# Patient Record
Sex: Female | Born: 1985 | Race: White | Hispanic: No | Marital: Married | Smoking: Current every day smoker
Health system: Southern US, Community
[De-identification: ages and names within clinical notes are randomized; demographics above are authoritative.]

## PROBLEM LIST (undated history)

## (undated) DIAGNOSIS — O99311 Alcohol use complicating pregnancy, first trimester: Secondary | ICD-10-CM

## (undated) DIAGNOSIS — R011 Cardiac murmur, unspecified: Secondary | ICD-10-CM

## (undated) DIAGNOSIS — N760 Acute vaginitis: Secondary | ICD-10-CM

## (undated) DIAGNOSIS — B9689 Other specified bacterial agents as the cause of diseases classified elsewhere: Secondary | ICD-10-CM

## (undated) DIAGNOSIS — B1920 Unspecified viral hepatitis C without hepatic coma: Secondary | ICD-10-CM

## (undated) DIAGNOSIS — N751 Abscess of Bartholin's gland: Secondary | ICD-10-CM

## (undated) HISTORY — PX: OTHER SURGICAL HISTORY: SHX169

---
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
## 2013-08-04 ENCOUNTER — Emergency Department (HOSPITAL_COMMUNITY)
Admission: EM | Admit: 2013-08-04 | Discharge: 2013-08-04 | Disposition: A | Discharge status: Home / Self Care | Payer: Self-pay | Attending: Emergency Medicine | Admitting: Emergency Medicine

## 2013-08-04 ENCOUNTER — Encounter (HOSPITAL_COMMUNITY): Payer: Self-pay | Admitting: Emergency Medicine

## 2013-08-04 DIAGNOSIS — F17200 Nicotine dependence, unspecified, uncomplicated: Secondary | ICD-10-CM | POA: Insufficient documentation

## 2013-08-04 DIAGNOSIS — N750 Cyst of Bartholin's gland: Secondary | ICD-10-CM | POA: Insufficient documentation

## 2013-08-04 DIAGNOSIS — Z3202 Encounter for pregnancy test, result negative: Secondary | ICD-10-CM | POA: Insufficient documentation

## 2013-08-04 LAB — POCT PREGNANCY, URINE: Preg Test, Ur: NEGATIVE

## 2013-08-04 MED ORDER — HYDROMORPHONE HCL PF 1 MG/ML IJ SOLN
1.0000 mg | Freq: Once | INTRAMUSCULAR | Status: AC
  Administered 2013-08-04: 1 mg via INTRAMUSCULAR
  Filled 2013-08-04: qty 1

## 2013-08-04 MED ORDER — OXYCODONE-ACETAMINOPHEN 5-325 MG PO TABS: 1.0000 | ORAL_TABLET | Freq: Four times a day (QID) | ORAL | Status: DC | PRN

## 2013-08-04 MED ORDER — OXYCODONE-ACETAMINOPHEN 5-325 MG PO TABS
2.0000 | ORAL_TABLET | Freq: Once | ORAL | Status: AC
  Administered 2013-08-04: 2 via ORAL
  Filled 2013-08-04: qty 2

## 2013-08-04 NOTE — ED Notes (Signed)
Heather PA at bedside.  

## 2013-08-04 NOTE — ED Notes (Signed)
Report given to Tiffany, RN. 

## 2013-08-04 NOTE — ED Notes (Signed)
Pt c/o abscess to left labia x 1 wk that has not come to a head and has had no drainage. Pt states she has a hx of vaginal abscesses before. Pt rate pain 10/10. Pt also states there is a possibility she may be pregnant.

## 2013-08-04 NOTE — ED Provider Notes (Signed)
CSN: [id number]     Arrival date & time 08/04/13  1201 History   First MD Initiated Contact with Patient 08/04/13 1218     Chief Complaint  Patient presents with  . Abscess   (Consider location/radiation/quality/duration/timing/severity/associated sxs/prior Treatment) HPI Comments: Patient presents with an abscess to the left labia.  She reports that the abscess has been present for the past 1.5 weeks and is gradually increasing in size.  She has not noticed any drainage from the area.  She reports that she has had a similar abscess twice in the past.  She denies fever, chills, nausea, vomiting, or vaginal discharge.  She is also concerned that she may be pregnant.  LMP was approximately 2-3 months ago.  She denies abdominal pain.  No history of DM.  Patient is a 27 y.o. female presenting with abscess. The history is provided by the patient.  Abscess   History reviewed. No pertinent past medical history. History reviewed. No pertinent past surgical history. History reviewed. No pertinent family history. History  Substance Use Topics  . Smoking status: Current Every Day Smoker -- 0.50 packs/day  . Smokeless tobacco: Not on file  . Alcohol Use: Not on file   OB History   Grav Para Term Preterm Abortions TAB SAB Ect Mult Living                 Review of Systems  All other systems reviewed and are negative.    Allergies  Aspirin and Neosporin  Home Medications  No current outpatient prescriptions on file. BP 133/82  Pulse 76  Temp(Src) 98.3 F (36.8 C) (Oral)  Resp 18  Ht 5' (1.524 m)  Wt 119 lb (53.978 kg)  BMI 23.24 kg/m2  SpO2 100%  LMP 05/21/2013 Physical Exam  Nursing note and vitals reviewed. Constitutional: She appears well-developed and well-nourished.  HENT:  Head: Normocephalic and atraumatic.  Mouth/Throat: Oropharynx is clear and moist.  Neck: Normal range of motion. Neck supple.  Cardiovascular: Normal rate, regular rhythm and normal heart sounds.    Pulmonary/Chest: Effort normal and breath sounds normal.  Abdominal: Soft. Bowel sounds are normal. There is no tenderness.  Genitourinary:  Left Bartholin cyst.  No active drainage.  No surrounding erythema, edema, or warmth.  Neurological: She is alert.  Skin: Skin is warm and dry.  Psychiatric: She has a normal mood and affect.    ED Course  Procedures (including critical care time) Labs Review Labs Reviewed  POCT PREGNANCY, URINE   Imaging Review No results found.  EKG Interpretation   None      INCISION AND DRAINAGE Performed by: Santiago Glad Consent: Verbal consent obtained. Risks and benefits: risks, benefits and alternatives were discussed Type: abscess  Body area: left labia  Anesthesia: local infiltration  Incision was made with a scalpel.  Local anesthetic: lidocaine 2% with epinephrine  Anesthetic total: 8 ml  Complexity: complex Blunt dissection to break up loculations  Drainage: purulent  Drainage amount: large  Packing material: Word catheter placed  Patient tolerance: Patient tolerated the procedure well with no immediate complications.    MDM  No diagnosis found. Patient with a left bartholin abscess.  Patient afebrile.  No surrounding cellulitis.  Patient is not immunocompromised.  Abscess incised and drained with Therefore, do not feel that antibiotics are indicated at this time.  Word catheter placed.  Patient given follow up with Gynecology. Return precautions given.    Santiago Glad, PA-C 08/05/13 [phone]

## 2013-08-04 NOTE — ED Notes (Signed)
Pt reports she has "a sore" on her labia x 1 week. Hx of abscesses in the past.

## 2013-08-04 NOTE — ED Notes (Signed)
Pt has hx of labial abs. Has another; thinks she could be pregnant. No drainage; afebrile.

## 2013-08-06 ENCOUNTER — Emergency Department (HOSPITAL_COMMUNITY)
Admission: EM | Admit: 2013-08-06 | Discharge: 2013-08-06 | Disposition: A | Discharge status: Home / Self Care | Payer: Self-pay | Attending: Emergency Medicine | Admitting: Emergency Medicine

## 2013-08-06 ENCOUNTER — Encounter (HOSPITAL_COMMUNITY): Payer: Self-pay | Admitting: Emergency Medicine

## 2013-08-06 DIAGNOSIS — F17200 Nicotine dependence, unspecified, uncomplicated: Secondary | ICD-10-CM | POA: Insufficient documentation

## 2013-08-06 DIAGNOSIS — N751 Abscess of Bartholin's gland: Secondary | ICD-10-CM

## 2013-08-06 MED ORDER — HYDROMORPHONE HCL PF 2 MG/ML IJ SOLN
2.0000 mg | Freq: Once | INTRAMUSCULAR | Status: AC
  Administered 2013-08-06: 2 mg via INTRAMUSCULAR
  Filled 2013-08-06: qty 1

## 2013-08-06 MED ORDER — HYDROCODONE-ACETAMINOPHEN 5-325 MG PO TABS: 1.0000 | ORAL_TABLET | Freq: Four times a day (QID) | ORAL | Status: DC | PRN

## 2013-08-06 MED ORDER — SULFAMETHOXAZOLE-TRIMETHOPRIM 800-160 MG PO TABS: 1.0000 | ORAL_TABLET | Freq: Two times a day (BID) | ORAL | Status: DC

## 2013-08-06 MED ORDER — ONDANSETRON 4 MG PO TBDP
4.0000 mg | ORAL_TABLET | Freq: Once | ORAL | Status: AC
  Administered 2013-08-06: 4 mg via ORAL
  Filled 2013-08-06: qty 1

## 2013-08-06 NOTE — ED Notes (Signed)
Pt reports that catheter for Bartholin's cyst came out this morning. Pt also reports abdominal pain with nausea.

## 2013-08-06 NOTE — ED Provider Notes (Addendum)
CSN: [id number]     Arrival date & time 08/06/13  1156 History   First MD Initiated Contact with Patient 08/06/13 1235     Chief Complaint  Patient presents with  . Bartholin's Cyst   (Consider location/radiation/quality/duration/timing/severity/associated sxs/prior Treatment) HPI Comments: Patient with a history of a Bartholin's cyst drained 2 days ago with a word catheter placed. The catheter fell out last night and patient was concerned about possible worsening swelling in reaccumulation of fluid. She states that she has been in severe pain and over-the-counter medications are not helping. She also states she was not given an antibiotic at that time. This is not the first time she has had a cyst  The history is provided by the patient.    History reviewed. No pertinent past medical history. History reviewed. No pertinent past surgical history. No family history on file. History  Substance Use Topics  . Smoking status: Current Every Day Smoker -- 0.50 packs/day  . Smokeless tobacco: Not on file  . Alcohol Use: No   OB History   Grav Para Term Preterm Abortions TAB SAB Ect Mult Living                 Review of Systems  All other systems reviewed and are negative.    Allergies  Aspirin and Neosporin  Home Medications  No current outpatient prescriptions on file. BP 129/85  Pulse 93  Temp(Src) 98.5 F (36.9 C) (Oral)  Resp 18  Ht 5' (1.524 m)  Wt 119 lb (53.978 kg)  BMI 23.24 kg/m2  SpO2 97%  LMP 05/21/2013 Physical Exam  Nursing note and vitals reviewed. Constitutional: She is oriented to person, place, and time. She appears well-developed and well-nourished. She appears distressed.  HENT:  Head: Normocephalic and atraumatic.  Eyes: Conjunctivae and EOM are normal. Pupils are equal, round, and reactive to light.  Neck: Neck supple.  Cardiovascular: Normal rate.   Pulmonary/Chest: Effort normal.  Abdominal: Soft. She exhibits no distension. There is no  tenderness. There is no rebound and no guarding.  Genitourinary:     Musculoskeletal: Normal range of motion. She exhibits no edema and no tenderness.  Neurological: She is alert and oriented to person, place, and time.  Skin: Skin is warm and dry. No rash noted. No erythema.  Psychiatric: She has a normal mood and affect. Her behavior is normal.    ED Course  Procedures (including critical care time) Labs Review Labs Reviewed - No data to display Imaging Review No results found.  EKG Interpretation   None       MDM   1. Bartholin's gland abscess     Patient presenting for recheck after a Bartholin's cyst drainage. The catheter fell out and she was concerned was getting worse. Patient went home without pain medicine or antibiotics until she would most likely benefit from both. At this time no evidence of reaccumulation of the cyst and no sign of induration or fluctuance. Will start on pain control and Bactrim.    Gwyneth Sprout, MD 08/06/13 1319  Gwyneth Sprout, MD 08/06/13 1321

## 2013-08-08 NOTE — ED Provider Notes (Signed)
Medical screening examination/treatment/procedure(s) were performed by non-physician practitioner and as supervising physician I was immediately available for consultation/collaboration.  EKG Interpretation   None        Holger Sokolowski R. Elizabeth Haff, MD 08/08/13 0657 

## 2014-04-10 ENCOUNTER — Emergency Department (HOSPITAL_COMMUNITY)
Admission: EM | Admit: 2014-04-10 | Discharge: 2014-04-11 | Discharge status: Against Medical Advice | Payer: Self-pay | Attending: Emergency Medicine | Admitting: Emergency Medicine

## 2014-04-10 ENCOUNTER — Encounter (HOSPITAL_COMMUNITY): Payer: Self-pay | Admitting: Emergency Medicine

## 2014-04-10 DIAGNOSIS — N949 Unspecified condition associated with female genital organs and menstrual cycle: Secondary | ICD-10-CM | POA: Insufficient documentation

## 2014-04-10 DIAGNOSIS — Z3202 Encounter for pregnancy test, result negative: Secondary | ICD-10-CM | POA: Insufficient documentation

## 2014-04-10 DIAGNOSIS — F17200 Nicotine dependence, unspecified, uncomplicated: Secondary | ICD-10-CM | POA: Insufficient documentation

## 2014-04-10 HISTORY — DX: Other specified bacterial agents as the cause of diseases classified elsewhere: B96.89

## 2014-04-10 HISTORY — DX: Unspecified viral hepatitis C without hepatic coma: B19.20

## 2014-04-10 HISTORY — DX: Other specified bacterial agents as the cause of diseases classified elsewhere: N76.0

## 2014-04-10 HISTORY — DX: Abscess of Bartholin's gland: N75.1

## 2014-04-10 LAB — URINALYSIS, ROUTINE W REFLEX MICROSCOPIC
Bilirubin Urine: NEGATIVE
Glucose, UA: NEGATIVE mg/dL
Ketones, ur: NEGATIVE mg/dL
LEUKOCYTES UA: NEGATIVE
NITRITE: NEGATIVE
PH: 6.5 (ref 5.0–8.0)
Protein, ur: NEGATIVE mg/dL
SPECIFIC GRAVITY, URINE: 1.029 (ref 1.005–1.030)
Urobilinogen, UA: 1 mg/dL (ref 0.0–1.0)

## 2014-04-10 LAB — COMPREHENSIVE METABOLIC PANEL
ALBUMIN: 3.7 g/dL (ref 3.5–5.2)
ALT: 113 U/L — ABNORMAL HIGH (ref 0–35)
AST: 64 U/L — ABNORMAL HIGH (ref 0–37)
Alkaline Phosphatase: 90 U/L (ref 39–117)
Anion gap: 19 — ABNORMAL HIGH (ref 5–15)
BUN: 11 mg/dL (ref 6–23)
CALCIUM: 8.8 mg/dL (ref 8.4–10.5)
CO2: 20 mEq/L (ref 19–32)
Chloride: 103 mEq/L (ref 96–112)
Creatinine, Ser: 0.75 mg/dL (ref 0.50–1.10)
GFR calc Af Amer: >90 mL/min (ref >90)
GFR calc non Af Amer: >90 mL/min (ref >90)
Glucose, Bld: 115 mg/dL — ABNORMAL HIGH (ref 70–99)
Potassium: 3.7 mEq/L (ref 3.7–5.3)
Sodium: 142 mEq/L (ref 137–147)
Total Bilirubin: 0.3 mg/dL (ref 0.3–1.2)
Total Protein: 7.3 g/dL (ref 6.0–8.3)

## 2014-04-10 LAB — CBC WITH DIFFERENTIAL/PLATELET
BASOS PCT: 0 % (ref 0–1)
Basophils Absolute: 0 10*3/uL (ref 0.0–0.1)
EOS ABS: 0.1 10*3/uL (ref 0.0–0.7)
EOS PCT: 2 % (ref 0–5)
HCT: 40.3 % (ref 36.0–46.0)
HEMOGLOBIN: 13.8 g/dL (ref 12.0–15.0)
Lymphocytes Relative: 30 % (ref 12–46)
Lymphs Abs: 1.9 10*3/uL (ref 0.7–4.0)
MCH: 32.6 pg (ref 26.0–34.0)
MCHC: 34.2 g/dL (ref 30.0–36.0)
MCV: 95.3 fL (ref 78.0–100.0)
MONOS PCT: 7 % (ref 3–12)
Monocytes Absolute: 0.4 10*3/uL (ref 0.1–1.0)
NEUTROS PCT: 61 % (ref 43–77)
Neutro Abs: 4 10*3/uL (ref 1.7–7.7)
Platelets: 165 10*3/uL (ref 150–400)
RBC: 4.23 MIL/uL (ref 3.87–5.11)
RDW: 14.7 % (ref 11.5–15.5)
WBC: 6.5 10*3/uL (ref 4.0–10.5)

## 2014-04-10 LAB — URINE MICROSCOPIC-ADD ON

## 2014-04-10 LAB — PREGNANCY, URINE: Preg Test, Ur: NEGATIVE

## 2014-04-10 NOTE — ED Notes (Addendum)
Pt. reports vaginal pain /low abdominal pain onset today , pt. stated [facility] incision/drainage of Bartholin's abscess. No fever or chills.

## 2014-04-10 NOTE — ED Notes (Signed)
Pager 12, which was pt's, was found in trash can in waiting room. PT no longer here.

## 2014-04-10 NOTE — ED Notes (Signed)
PT not answering for reassessment.

## 2014-04-10 NOTE — ED Notes (Addendum)
PT's fiance is combative and angry. States we are not doing a good job and she needs to be taken back now. RN explained triage system and that pt had been triaged appropriately. Reassured that ED was doing a good job and that she had several people in front of her and would be taken back in an orderly manner. Acknowledged pt's caring and frustration but reaffirmed pt triaged appropriately. AD, Zack made aware. PT unavailable for meeting with AD.

## 2014-05-10 ENCOUNTER — Emergency Department: Payer: Self-pay | Admitting: Emergency Medicine

## 2014-06-10 ENCOUNTER — Emergency Department: Payer: Self-pay | Admitting: Emergency Medicine

## 2014-08-13 ENCOUNTER — Emergency Department: Payer: Self-pay | Admitting: Emergency Medicine

## 2014-08-19 ENCOUNTER — Emergency Department: Payer: Self-pay | Admitting: Internal Medicine

## 2014-08-19 LAB — CBC WITH DIFFERENTIAL/PLATELET
Basophil #: 0 10*3/uL (ref 0.0–0.1)
Basophil %: 0.3 %
EOS PCT: 0.4 %
Eosinophil #: 0 10*3/uL (ref 0.0–0.7)
HCT: 39.2 % (ref 35.0–47.0)
HGB: 13.5 g/dL (ref 12.0–16.0)
LYMPHS PCT: 36.6 %
Lymphocyte #: 2 10*3/uL (ref 1.0–3.6)
MCH: 35 pg — ABNORMAL HIGH (ref 26.0–34.0)
MCHC: 34.4 g/dL (ref 32.0–36.0)
MCV: 102 fL — AB (ref 80–100)
Monocyte #: 0.4 x10 3/mm (ref 0.2–0.9)
Monocyte %: 8 %
Neutrophil #: 3 10*3/uL (ref 1.4–6.5)
Neutrophil %: 54.7 %
Platelet: 174 10*3/uL (ref 150–440)
RBC: 3.86 10*6/uL (ref 3.80–5.20)
RDW: 12.8 % (ref 11.5–14.5)
WBC: 5.6 10*3/uL (ref 3.6–11.0)

## 2014-08-19 LAB — COMPREHENSIVE METABOLIC PANEL
ALBUMIN: 3.5 g/dL (ref 3.4–5.0)
ANION GAP: 8 (ref 7–16)
AST: 35 U/L (ref 15–37)
Alkaline Phosphatase: 75 U/L
BUN: 7 mg/dL (ref 7–18)
Bilirubin,Total: 0.2 mg/dL (ref 0.2–1.0)
CALCIUM: 8.5 mg/dL (ref 8.5–10.1)
Chloride: 106 mmol/L (ref 98–107)
Co2: 22 mmol/L (ref 21–32)
Creatinine: 0.67 mg/dL (ref 0.60–1.30)
EGFR (Non-African Amer.): >60
Glucose: 104 mg/dL — ABNORMAL HIGH (ref 65–99)
Osmolality: 270 (ref 275–301)
POTASSIUM: 3.7 mmol/L (ref 3.5–5.1)
SGPT (ALT): 70 U/L — ABNORMAL HIGH
SODIUM: 136 mmol/L (ref 136–145)
TOTAL PROTEIN: 7.1 g/dL (ref 6.4–8.2)

## 2014-08-19 LAB — URINALYSIS, COMPLETE
Bacteria: NONE SEEN
Bilirubin,UR: NEGATIVE
Blood: NEGATIVE
Glucose,UR: NEGATIVE mg/dL (ref 0–75)
Ketone: NEGATIVE
LEUKOCYTE ESTERASE: NEGATIVE
Nitrite: NEGATIVE
PROTEIN: NEGATIVE
Ph: 7 (ref 4.5–8.0)
RBC,UR: <1 /HPF (ref 0–5)
Specific Gravity: 1.009 (ref 1.003–1.030)
WBC UR: <1 /HPF (ref 0–5)

## 2014-08-19 LAB — HCG, QUANTITATIVE, PREGNANCY: Beta Hcg, Quant.: 38812 m[IU]/mL — ABNORMAL HIGH

## 2014-08-19 LAB — WET PREP, GENITAL

## 2014-08-19 LAB — GC/CHLAMYDIA PROBE AMP

## 2014-09-21 NOTE — L&D Delivery Note (Signed)
Delivery Note At 1:13 AM a viable female was delivered via Vaginal, Spontaneous Delivery (Presentation: cephalic ; Occiput Anterior).  APGAR: 8, 9; weight 4 lb 14.7 oz (2230 g).   Placenta status: Intact, Spontaneous.  Cord: 3 vessels with the following complications: None.  Cord pH: not sent  Anesthesia: None  Episiotomy: None Lacerations: None Suture Repair: n/a Est. Blood Loss (mL): 350  Mom to postpartum.  Baby to Couplet care / Skin to Skin.  Called to see patient as she was complete and pushing.  Mom pushed to delivery viable female infant.  The head followed by shoulders, which delivered without difficulty, and the rest of the body.  A single, loose nuchal cord noted and reduced.  Baby to mom's chest.  Cord clamped and cut after > 1 min delay.  Cord blood obtained.  Placenta delivered spontaneously, intact, with a 3-vessel cord.  Mild abrasions noted that were hemostatic without intervention.  All counts correct.  Hemostasis obtained with IV pitocin and fundal massage. EBL 350 mL.     Conard NovakStephen D. Koreen Lizaola, MD, FACOG 04/07/2015 02:00 AM

## 2014-10-29 LAB — OB RESULTS CONSOLE HEPATITIS B SURFACE ANTIGEN: Hepatitis B Surface Ag: NEGATIVE

## 2014-10-29 LAB — OB RESULTS CONSOLE VARICELLA ZOSTER ANTIBODY, IGG: Varicella: IMMUNE

## 2014-10-29 LAB — OB RESULTS CONSOLE HIV ANTIBODY (ROUTINE TESTING): HIV: NONREACTIVE

## 2014-10-29 LAB — OB RESULTS CONSOLE ABO/RH: RH Type: POSITIVE

## 2014-10-29 LAB — OB RESULTS CONSOLE RUBELLA ANTIBODY, IGM: RUBELLA: NON-IMMUNE/NOT IMMUNE

## 2014-10-29 LAB — OB RESULTS CONSOLE RPR: RPR: NONREACTIVE

## 2014-10-29 LAB — OB RESULTS CONSOLE ANTIBODY SCREEN: ANTIBODY SCREEN: NEGATIVE

## 2015-01-09 ENCOUNTER — Emergency Department
Admit: 2015-01-09 | Disposition: A | Discharge status: Home / Self Care | Payer: Self-pay | Admitting: Emergency Medicine

## 2015-01-11 ENCOUNTER — Ambulatory Visit
Admit: 2015-01-11 | Disposition: A | Discharge status: Home / Self Care | Payer: Self-pay | Attending: Obstetrics & Gynecology | Admitting: Obstetrics & Gynecology

## 2015-01-20 NOTE — Op Note (Signed)
PATIENT NAME:  [name], Laurie Oliver DATE OF BIRTH:  1986/05/07  DATE OF PROCEDURE:  01/11/2015  PREOPERATIVE DIAGNOSIS: Left Bartholin cyst.  POSTOPERATIVE DIAGNOSIS: Left Bartholin cyst.  PROCEDURE: Marsupialization of left Bartholin cyst.  SURGEON: Dierdre Searles. Paul Talaysia Pinheiro, M.D.   ANESTHESIA: General.   BLOOD LOSS: Minimal.   COMPLICATIONS: None.   FINDINGS: Left chronic area of inflammation and swelling related to Bartholin cyst. The patient had noted fetal heart tones 140s after anesthesia was performed.   DISPOSITION: To recovery room in stable condition.   TECHNIQUE: The patient was prepped and draped in the usual sterile fashion after adequate anesthesia was obtained. In the dorsal lithotomy position, the left Bartholin cyst was easily palpated with a tract running cephalad. Allis clamp was used to grasp a scarred and mucosal area at the vaginal opening, and this was excised in an elliptical fashion. On deeper penetration, I achieved penetration into the tract, which a Kelly clamp can be placed approximately 4 cm deep and cephalad, as far as this chronic cystic tract has formed. There is minimal bleeding noted; 1-0 Vicryl sutures were used in a clockwise fashion to suture the inner wall of the tract to the mucosa. There is some thickened scarring and inflamed tissues that complicate the placement of these sutures as far as concern for its ability or predictability to maintain the tract open over the long term long term; however, at the conclusion of the case, there was no bleeding noted. The tract was still patent with multiple sutures around the circumference of the clockwise-like closure.   The patient tolerated the procedure well and goes to recovery in stable condition.   All sponge, instrument and needle counts were correct at the conclusion of the case.    ____________________________ R. Annamarie MajorPaul Oluwaseun Cremer, MD rph:JT D: 01/11/2015 11:12:15 ET T: 01/11/2015 11:39:09  ET JOB#: [id number]  cc: Dierdre Searles. Paul [name], MD, <Dictator> Laurie Oliver Sufyaan Palma MD ELECTRONICALLY SIGNED 01/11/2015 13:56

## 2015-01-21 ENCOUNTER — Ambulatory Visit: Payer: Medicaid Other | Attending: Maternal & Fetal Medicine

## 2015-01-24 LAB — OB RESULTS CONSOLE RPR: RPR: NONREACTIVE

## 2015-01-24 LAB — OB RESULTS CONSOLE HIV ANTIBODY (ROUTINE TESTING): HIV: NONREACTIVE

## 2015-02-07 ENCOUNTER — Institutional Professional Consult (permissible substitution): Payer: Medicaid Other

## 2015-02-11 ENCOUNTER — Ambulatory Visit
Admission: RE | Admit: 2015-02-11 | Discharge: 2015-02-11 | Disposition: A | Discharge status: Home / Self Care | Payer: Medicaid Other | Source: Ambulatory Visit | Attending: Maternal & Fetal Medicine | Admitting: Maternal & Fetal Medicine

## 2015-02-11 ENCOUNTER — Encounter: Payer: Self-pay | Admitting: Maternal & Fetal Medicine

## 2015-02-11 DIAGNOSIS — O99320 Drug use complicating pregnancy, unspecified trimester: Secondary | ICD-10-CM

## 2015-02-11 DIAGNOSIS — B1920 Unspecified viral hepatitis C without hepatic coma: Secondary | ICD-10-CM

## 2015-02-11 DIAGNOSIS — O99330 Smoking (tobacco) complicating pregnancy, unspecified trimester: Secondary | ICD-10-CM | POA: Diagnosis not present

## 2015-02-11 NOTE — Progress Notes (Signed)
MFM Consultation (USE)  Laurie Oliver is a 29 y.o. G3P0020 at 31 weeks 3 days gestation (EDD 04/13/15 by 6w 2d [place] on 08/20/14 at [facility]) with presenting for perinatal consultation due to history of Hepatitis C and subutex use. 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 She is currently taking  of Subutex daily.  History  OB History     Gravida  Para  Term  Preterm  AB  TAB  SAB  Ectopic  Multiple  Living    3  0    2   2          Past Medical History   Diagnosis  Date   .  Bartholin's gland abscess    .  Bacterial vaginosis    .  Hepatitis C    11/19/14 Hepatitis C: HCV  PCR 382, 440 IU/mL Hep C genotype 3  Past Surgical History   Procedure  Laterality  Date   .  barholins gland abscess drainage      12/2014: marsupilization of bartholin's gland abscess.  Family History: she reports +family history (paternal gf) of CABG.  Social History: recently married. 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 She smoks 1/2 -1 ppd and denies use of alcohol or other drugs during pregnancy.    Last menstrual period 07/07/2014.  Exam  Physical Exam FHR 150s  Prenatal labs (10/2014):  ABO, Rh:  Opositive Antibody: negative Rubella: NON immune RPR: nonreactive HBsAg: negative HIV: negative Varicella immune Pap negative 10/2014 Negative Gc/Clam neg AST/ALT 34/64 (10/30/14)  Assessment/Plan:  1. Hepatitis C during pregnancy--we addressed risks to the neonatal for vertical transmission (~5%) and higher risks for transmission associated with concomitant HIV, Some obstetric procedures (eg scalp electrode use), prolonged rupture of membranes (>6h), and higher maternal HCV RNA viral loads (low risk for tranmission if viral load <1x 10^6/ml or negative viral load). Cesarean section does not appear protective. Genotype is not associated with vertical transmission.  Breastfeeding is considered safe by ACOG and  AAP. Treatment options during pregnancy are limited, however newer antiviral medications provide better treatment options and less toxicity for patients with chronic Hepatitis C.  --I agree with referral to hepatology for evaluation and postnatal follow up  --she should have viral titers repeated in third trimester (she reports recent lab draw but I don't have results)  --Right upper quadrant/liver ultrasound  --She has demonstrated some difficulty with appointment follow up and these evaluations can also be scheduled in the postnatal period. She did express interest in a hepatology consultation (she thought her visit today was for that purpose)  2. Tobacco Use--we reviewed the risks for tobacco use during pregnancy including risk for fetal growth restriction and abruption. She feels she cannot reduce usage presently due to stressors. She is followed by [facility] but may also benefit from additional smoking cessation assistance.  3. Subutex--receives medications from [facility]. She reported need for subutex due to chronic pain needs from multiple bartholin's abscesses. We discussed the need for evaluation of the neonate postnatally for signs of withdrawal and possible need for neonatal therapy. I stressed importance of a discussion with neonatology to further discuss how infant will be cared for in postnatal period. I see you have planned for her to have a neonatology consultation.   Recommendations:  Serial (montly growth scans)  We generally recommend weekly antenatal testing beginning at 36 weeks  due to maternal chronic substance use.  Laurie Oliver  02/11/2015, 3:58 PM

## 2015-02-11 NOTE — Progress Notes (Signed)
James IvanoffShannon N Finlay is a 29 y.o. G3P0020 at 31 weeks 3 days gestation (EDD 04/13/15) with presenting for perinatal consultation due to history of Hepatitis C and subutex use.  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  She is currently taking 8mg  of Subutex daily.   History OB History    Gravida Para Term Preterm AB TAB SAB Ectopic Multiple Living   3 0   2  2        Past Medical History  Diagnosis Date  . Bartholin's gland abscess   . Bacterial vaginosis   . Hepatitis C    Past Surgical History  Procedure Laterality Date  .  barholins gland abscess drainage     12/2014: marsupilization of bartholin's gland abscess.  Family History: she reports +family history (paternal gf) of CABG. Social History: recently married.  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  She smoks 1/2 -1 ppd and denies use of alcohol or other drugs during pregnancy.    Prenatal Transfer Tool   Maternal Substance Abuse  Significant Maternal Medications   ROS as above    Last menstrual period 07/07/2014. Exam Physical Exam  FHR 150s   Assessment/Plan: 1. Hepatitis C during pregnancy--we addressed risks to the neonatal for vertical transmission (~5%) and higher risks for transmission associated with concomitant HIV,  Some obstetric procedures (eg scalp electrode use), prolonged rupture of membranes (>6h), and higher maternal HCV RNA viral loads (low risk for tranmission if viral load <1x 10^6/ml or negative viral load).  Cesarean section does not appear protective.  Breastfeeding is considered safe by ACOG and AAP. Treatment options during pregnancy are limited, however newer antiviral medications provide better treatment options and less toxicity for patients with chronic Hepatitis C.  --I agree with referral to hepatology for evaluation and postnatal follow up --she should have viral titers repeated in third trimester (she  reports recent lab draw but I don't have results) --Right upper quadrant/liver ultrasound  --She has demonstrated some difficulty with appointment follow up and these evaluations can also be scheduled in the postnatal period. She did express interest in a hepatology consultation (she thought her visit today was for that purpose)  2. Tobacco Use--we reviewed the risks for tobacco use during pregnancy including risk for fetal growth restriction and abruption.  She feels she cannot reduce usage presently due to stressors.  She is followed by [facility] but may also benefit from additional smoking cessation assistance.   3. Subutex--receives medications from [facility]. She reported need for subutex due to chronic pain needs from multiple bartholin's abscesses.  We discussed the need for evaluation of the neonate postnatally for signs of withdrawal and possible need for neonatal therapy. I stressed importance of a discussion with neonatology to further discuss how infant will be cared for in postnatal period.  I see you have planned for her to have a neonatology consultation.   Recommendations: Serial (montly growth scans) We generally recommend weekly antenatal testing beginning at 36 weeks due to maternal chronic substance use.    Alanda SlimMaria J Octavious Zidek 02/11/2015, 3:58 PM

## 2015-03-26 LAB — OB RESULTS CONSOLE GBS: STREP GROUP B AG: NEGATIVE

## 2015-03-26 LAB — OB RESULTS CONSOLE GC/CHLAMYDIA
Chlamydia: NEGATIVE
Gonorrhea: NEGATIVE

## 2015-04-05 ENCOUNTER — Inpatient Hospital Stay
Admission: EM | Admit: 2015-04-05 | Discharge: 2015-04-09 | DRG: 774 | Disposition: A | Discharge status: Home / Self Care | Payer: Medicaid Other | Attending: Obstetrics and Gynecology | Admitting: Obstetrics and Gynecology

## 2015-04-05 DIAGNOSIS — O9842 Viral hepatitis complicating childbirth: Principal | ICD-10-CM | POA: Diagnosis present

## 2015-04-05 DIAGNOSIS — F1911 Other psychoactive substance abuse, in remission: Secondary | ICD-10-CM | POA: Diagnosis present

## 2015-04-05 DIAGNOSIS — Z3A38 38 weeks gestation of pregnancy: Secondary | ICD-10-CM | POA: Diagnosis present

## 2015-04-05 DIAGNOSIS — F1910 Other psychoactive substance abuse, uncomplicated: Secondary | ICD-10-CM

## 2015-04-05 DIAGNOSIS — B1920 Unspecified viral hepatitis C without hepatic coma: Secondary | ICD-10-CM | POA: Diagnosis present

## 2015-04-05 DIAGNOSIS — O99323 Drug use complicating pregnancy, third trimester: Secondary | ICD-10-CM

## 2015-04-05 DIAGNOSIS — IMO0002 Reserved for concepts with insufficient information to code with codable children: Secondary | ICD-10-CM | POA: Diagnosis present

## 2015-04-05 LAB — COMPREHENSIVE METABOLIC PANEL
ALK PHOS: 134 U/L — AB (ref 38–126)
ALT: 56 U/L — ABNORMAL HIGH (ref 14–54)
ANION GAP: 7 (ref 5–15)
AST: 36 U/L (ref 15–41)
Albumin: 3.2 g/dL — ABNORMAL LOW (ref 3.5–5.0)
BILIRUBIN TOTAL: 0.3 mg/dL (ref 0.3–1.2)
BUN: 14 mg/dL (ref 6–20)
CO2: 21 mmol/L — ABNORMAL LOW (ref 22–32)
Calcium: 8.4 mg/dL — ABNORMAL LOW (ref 8.9–10.3)
Chloride: 106 mmol/L (ref 101–111)
Creatinine, Ser: 0.65 mg/dL (ref 0.44–1.00)
GFR calc Af Amer: >60 mL/min (ref >60)
GFR calc non Af Amer: >60 mL/min (ref >60)
Glucose, Bld: 98 mg/dL (ref 65–99)
Potassium: 3.8 mmol/L (ref 3.5–5.1)
SODIUM: 134 mmol/L — AB (ref 135–145)
TOTAL PROTEIN: 6.5 g/dL (ref 6.5–8.1)

## 2015-04-05 LAB — URINE DRUG SCREEN, QUALITATIVE (ARMC ONLY)
AMPHETAMINES, UR SCREEN: POSITIVE — AB
BARBITURATES, UR SCREEN: NOT DETECTED
Benzodiazepine, Ur Scrn: NOT DETECTED
CANNABINOID 50 NG, UR ~~LOC~~: NOT DETECTED
Cocaine Metabolite,Ur ~~LOC~~: NOT DETECTED
MDMA (ECSTASY) UR SCREEN: NOT DETECTED
METHADONE SCREEN, URINE: NOT DETECTED
OPIATE, UR SCREEN: NOT DETECTED
PHENCYCLIDINE (PCP) UR S: NOT DETECTED
TRICYCLIC, UR SCREEN: NOT DETECTED

## 2015-04-05 LAB — CBC
HCT: 37.9 % (ref 35.0–47.0)
HEMOGLOBIN: 12.9 g/dL (ref 12.0–16.0)
MCH: 31.9 pg (ref 26.0–34.0)
MCHC: 33.9 g/dL (ref 32.0–36.0)
MCV: 94 fL (ref 80.0–100.0)
Platelets: 159 10*3/uL (ref 150–440)
RBC: 4.03 MIL/uL (ref 3.80–5.20)
RDW: 13.2 % (ref 11.5–14.5)
WBC: 9.6 10*3/uL (ref 3.6–11.0)

## 2015-04-05 MED ORDER — DINOPROSTONE 10 MG VA INST
10.0000 mg | VAGINAL_INSERT | Freq: Once | VAGINAL | Status: AC
  Administered 2015-04-06: 10 mg via VAGINAL
  Filled 2015-04-05: qty 1

## 2015-04-05 MED ORDER — NICOTINE 14 MG/24HR TD PT24
14.0000 mg | MEDICATED_PATCH | Freq: Every day | TRANSDERMAL | Status: DC
  Administered 2015-04-05 – 2015-04-06 (×2): 14 mg via TRANSDERMAL
  Filled 2015-04-05 (×5): qty 1

## 2015-04-05 MED ORDER — ACETAMINOPHEN 325 MG PO TABS: 650.0000 mg | ORAL_TABLET | ORAL | Status: DC | PRN

## 2015-04-05 MED ORDER — CITRIC ACID-SODIUM CITRATE 334-500 MG/5ML PO SOLN: 30.0000 mL | ORAL | Status: DC | PRN

## 2015-04-05 MED ORDER — LACTATED RINGERS IV SOLN
INTRAVENOUS | Status: DC
  Administered 2015-04-05 – 2015-04-06 (×2): via INTRAVENOUS

## 2015-04-05 MED ORDER — LIDOCAINE HCL (PF) 1 % IJ SOLN: 30.0000 mL | INTRAMUSCULAR | Status: DC | PRN

## 2015-04-05 MED ORDER — TERBUTALINE SULFATE 1 MG/ML IJ SOLN: 0.2500 mg | Freq: Once | INTRAMUSCULAR | Status: AC | PRN

## 2015-04-05 MED ORDER — OXYTOCIN 40 UNITS IN LACTATED RINGERS INFUSION - SIMPLE MED
62.5000 mL/h | INTRAVENOUS | Status: DC
  Filled 2015-04-05: qty 1000

## 2015-04-05 MED ORDER — ONDANSETRON HCL 4 MG/2ML IJ SOLN
4.0000 mg | Freq: Four times a day (QID) | INTRAMUSCULAR | Status: DC | PRN
  Administered 2015-04-07: 4 mg via INTRAVENOUS
  Filled 2015-04-05: qty 2

## 2015-04-05 MED ORDER — BUPRENORPHINE HCL 2 MG SL SUBL
4.0000 mg | SUBLINGUAL_TABLET | Freq: Two times a day (BID) | SUBLINGUAL | Status: DC
  Administered 2015-04-06 – 2015-04-09 (×8): 4 mg via SUBLINGUAL
  Filled 2015-04-05 (×8): qty 2

## 2015-04-05 MED ORDER — LACTATED RINGERS IV SOLN: 500.0000 mL | INTRAVENOUS | Status: DC | PRN

## 2015-04-05 MED ORDER — OXYTOCIN BOLUS FROM INFUSION
500.0000 mL | INTRAVENOUS | Status: DC
  Administered 2015-04-07: 500 mL via INTRAVENOUS

## 2015-04-06 LAB — TYPE AND SCREEN
ABO/RH(D): O POS
Antibody Screen: NEGATIVE

## 2015-04-06 LAB — ABO/RH: ABO/RH(D): O POS

## 2015-04-06 MED ORDER — SODIUM CHLORIDE 0.9 % IJ SOLN
INTRAMUSCULAR | Status: AC
  Filled 2015-04-06: qty 3

## 2015-04-06 MED ORDER — TERBUTALINE SULFATE 1 MG/ML IJ SOLN: 0.2500 mg | Freq: Once | INTRAMUSCULAR | Status: AC | PRN

## 2015-04-06 MED ORDER — OXYTOCIN 40 UNITS IN LACTATED RINGERS INFUSION - SIMPLE MED: 1.0000 m[IU]/min | INTRAVENOUS | Status: DC

## 2015-04-06 MED ORDER — MISOPROSTOL 25 MCG QUARTER TABLET
25.0000 ug | ORAL_TABLET | ORAL | Status: DC | PRN
  Administered 2015-04-06: 25 ug via VAGINAL
  Filled 2015-04-06: qty 1

## 2015-04-06 MED ORDER — MISOPROSTOL 25 MCG QUARTER TABLET
ORAL_TABLET | ORAL | Status: AC
  Administered 2015-04-06: 25 ug via VAGINAL
  Filled 2015-04-06: qty 0.25

## 2015-04-06 NOTE — Progress Notes (Signed)
Dr Jean RosenthalJackson given update- also notified that pt interested in having a shower this evening, and possibly something light to eat. Dr Jean RosenthalJackson providing order for shower, requesting v/e prior to next dose of Cytotec, and call him with result .

## 2015-04-06 NOTE — Progress Notes (Signed)
Complaining of constant urge to void. Up to bathroom frequently. MD aware.

## 2015-04-06 NOTE — Progress Notes (Signed)
Patient has received two doses of misoprostol and is now mostly effaced.   Will allow her to eat and will start pitocin.   fwb reassuring at cat 1, maternal vitals stable and normal.

## 2015-04-06 NOTE — Progress Notes (Signed)
Patient ID: Laurie Oliver, female   DOB: 01/16/1986, 29 y.o.   MRN: [id number] L&D Note  04/06/2015 - 1:59 PM  28 y.o. G3P0020 2040w0d.   Patient Active Problem List   Diagnosis Date Noted  . Fetal growth restriction 04/05/2015    Subjective:  comfortable  Objective:   Filed Vitals:   04/06/15 0644 04/06/15 0645 04/06/15 0747 04/06/15 1320  BP: 141/99 132/90 127/79 114/83  Pulse: 86 65 81 79  Temp:   97.7 F (36.5 C) 98.1 F (36.7 C)  TempSrc:   Oral Oral  Resp:   16   Height:      Weight:        Current Vital Signs 24h Vital Sign Ranges  T 98.1 F (36.7 C) Temp  Avg: 98 F (36.7 C)  Min: 97.7 F (36.5 C)  Max: 98.2 F (36.8 C)  BP 114/83 mmHg BP  Min: 114/83  Max: 141/99  HR 79 Pulse  Avg: 84.3  Min: 61  Max: 115  RR 16 Resp  Avg: 17.3  Min: 16  Max: 18  SaO2     No Data Recorded       24 Hour I/O Current Shift I/O  Time Ins Outs 07/15 0701 - 07/16 0700 In: 527.1 [I.V.:527.1] Out: -  07/16 0701 - 07/16 1900 In: 600 [I.V.:600] Out: -     FHR: 125/mod var/+accels/no decels Toco: 1-2 q 10 min Gen: NAD SVE: FT/70%/-3  Labs:   Recent Labs Lab 04/05/15 2324  WBC 9.6  HGB 12.9  HCT 37.9  PLT 159    Recent Labs Lab 04/05/15 2324  NA 134*  K 3.8  CL 106  CO2 21*  BUN 14  CREATININE 0.65  CALCIUM 8.4*  PROT 6.5  BILITOT 0.3  ALKPHOS 134*  ALT 56*  AST 36  GLUCOSE 98    Medications SCHEDULED MEDICATIONS  . buprenorphine  4 mg Sublingual BID  . nicotine  14 mg Transdermal Daily    MEDICATION INFUSIONS  . lactated ringers 125 mL/hr at 04/05/15 2247  . oxytocin 40 units in LR 1000 mL    . oxytocin 40 units in LR 1000 mL      PRN MEDICATIONS  acetaminophen, citric acid-sodium citrate, lactated ringers, lidocaine (PF), ondansetron   Assessment & Plan:  29 y.o. [id number] IOL for Fetal growth restriction *IUP: Change to cytotec. Discussed that this is an off-label use of this medication and that risk of fetus not tolerating medication exists.  Patient agrees to proceed. Will place 25mcg PV Q4 or per unit protocol. * FWB: reassuring *GBS: negative *Analgesia: prn  Conard NovakJackson, Stephen D, MD 04/06/2015 1:59 PM  [facility]

## 2015-04-06 NOTE — Progress Notes (Signed)
OB Note Bedside u/s done and fetus is cephalic, subjectively normal fluid and normal FHR. Plan of care d/w pt and will proceed with cervical ripening overnight.   Laurie Oliver, Jr MD [facility]  Pager: [phone]

## 2015-04-06 NOTE — Progress Notes (Signed)
Dr Jean RosenthalJackson given update- notified that pt sleeping inbetween hourly rounds x last 2 hours- not aware of all uterine activity. Pt declining shower in favor of sleep this evening- Dr Jean RosenthalJackson stated that pt may have a lite diet prior to starting Pitocin- and that he will place orders for such this evening

## 2015-04-07 ENCOUNTER — Encounter: Payer: Self-pay | Admitting: *Deleted

## 2015-04-07 DIAGNOSIS — O99323 Drug use complicating pregnancy, third trimester: Secondary | ICD-10-CM | POA: Diagnosis present

## 2015-04-07 DIAGNOSIS — F1911 Other psychoactive substance abuse, in remission: Secondary | ICD-10-CM | POA: Diagnosis present

## 2015-04-07 DIAGNOSIS — F1910 Other psychoactive substance abuse, uncomplicated: Secondary | ICD-10-CM | POA: Diagnosis present

## 2015-04-07 LAB — CBC
HCT: 37.8 % (ref 35.0–47.0)
HCT: 41.2 % (ref 35.0–47.0)
Hemoglobin: 13 g/dL (ref 12.0–16.0)
Hemoglobin: 14.3 g/dL (ref 12.0–16.0)
MCH: 32.2 pg (ref 26.0–34.0)
MCH: 32.3 pg (ref 26.0–34.0)
MCHC: 34.4 g/dL (ref 32.0–36.0)
MCHC: 34.7 g/dL (ref 32.0–36.0)
MCV: 93.1 fL (ref 80.0–100.0)
MCV: 93.5 fL (ref 80.0–100.0)
PLATELETS: 158 10*3/uL (ref 150–440)
Platelets: 155 10*3/uL (ref 150–440)
RBC: 4.04 MIL/uL (ref 3.80–5.20)
RBC: 4.42 MIL/uL (ref 3.80–5.20)
RDW: 13.1 % (ref 11.5–14.5)
RDW: 13.3 % (ref 11.5–14.5)
WBC: 13 10*3/uL — ABNORMAL HIGH (ref 3.6–11.0)
WBC: 14.8 10*3/uL — AB (ref 3.6–11.0)

## 2015-04-07 LAB — URINE DRUG SCREEN, QUALITATIVE (ARMC ONLY)
Amphetamines, Ur Screen: NOT DETECTED
BARBITURATES, UR SCREEN: NOT DETECTED
BENZODIAZEPINE, UR SCRN: NOT DETECTED
COCAINE METABOLITE, UR ~~LOC~~: NOT DETECTED
Cannabinoid 50 Ng, Ur ~~LOC~~: NOT DETECTED
MDMA (Ecstasy)Ur Screen: NOT DETECTED
Methadone Scn, Ur: NOT DETECTED
Opiate, Ur Screen: POSITIVE — AB
Phencyclidine (PCP) Ur S: NOT DETECTED
TRICYCLIC, UR SCREEN: NOT DETECTED

## 2015-04-07 LAB — RPR: RPR Ser Ql: NONREACTIVE

## 2015-04-07 MED ORDER — ONDANSETRON HCL 4 MG PO TABS: 4.0000 mg | ORAL_TABLET | ORAL | Status: DC | PRN

## 2015-04-07 MED ORDER — FENTANYL 2.5 MCG/ML W/ROPIVACAINE 0.2% IN NS 100 ML EPIDURAL INFUSION (ARMC-ANES)
EPIDURAL | Status: AC
  Filled 2015-04-07: qty 100

## 2015-04-07 MED ORDER — DIPHENHYDRAMINE HCL 25 MG PO CAPS: 25.0000 mg | ORAL_CAPSULE | Freq: Four times a day (QID) | ORAL | Status: DC | PRN

## 2015-04-07 MED ORDER — OXYTOCIN 40 UNITS IN LACTATED RINGERS INFUSION - SIMPLE MED
62.5000 mL/h | INTRAVENOUS | Status: DC | PRN
  Administered 2015-04-07: 62.5 mL/h via INTRAVENOUS
  Filled 2015-04-07: qty 1000

## 2015-04-07 MED ORDER — BENZOCAINE-MENTHOL 20-0.5 % EX AERO
1.0000 "application " | INHALATION_SPRAY | Freq: Four times a day (QID) | CUTANEOUS | Status: DC | PRN
  Administered 2015-04-07: 1 via TOPICAL
  Filled 2015-04-07: qty 56

## 2015-04-07 MED ORDER — BENZOCAINE-MENTHOL 20-0.5 % EX AERO
1.0000 "application " | INHALATION_SPRAY | CUTANEOUS | Status: DC | PRN
  Filled 2015-04-07: qty 56

## 2015-04-07 MED ORDER — DIBUCAINE 1 % RE OINT: 1.0000 "application " | TOPICAL_OINTMENT | RECTAL | Status: DC | PRN

## 2015-04-07 MED ORDER — IBUPROFEN 600 MG PO TABS: 600.0000 mg | ORAL_TABLET | Freq: Four times a day (QID) | ORAL | Status: DC

## 2015-04-07 MED ORDER — MEASLES, MUMPS & RUBELLA VAC ~~LOC~~ INJ: 0.5000 mL | INJECTION | SUBCUTANEOUS | Status: DC | PRN

## 2015-04-07 MED ORDER — SENNOSIDES-DOCUSATE SODIUM 8.6-50 MG PO TABS
2.0000 | ORAL_TABLET | ORAL | Status: DC
  Administered 2015-04-07 – 2015-04-08 (×2): 2 via ORAL
  Filled 2015-04-07 (×3): qty 2

## 2015-04-07 MED ORDER — PRENATAL MULTIVITAMIN CH
1.0000 | ORAL_TABLET | Freq: Every day | ORAL | Status: DC
  Administered 2015-04-07 – 2015-04-09 (×3): 1 via ORAL
  Filled 2015-04-07 (×3): qty 1

## 2015-04-07 MED ORDER — WITCH HAZEL-GLYCERIN EX PADS: 1.0000 "application " | MEDICATED_PAD | CUTANEOUS | Status: DC | PRN

## 2015-04-07 MED ORDER — HYDROCODONE-ACETAMINOPHEN 5-325 MG PO TABS
1.0000 | ORAL_TABLET | Freq: Four times a day (QID) | ORAL | Status: DC | PRN
  Administered 2015-04-07 – 2015-04-09 (×11): 1 via ORAL
  Filled 2015-04-07 (×11): qty 1

## 2015-04-07 MED ORDER — FERROUS SULFATE 325 (65 FE) MG PO TABS
325.0000 mg | ORAL_TABLET | Freq: Two times a day (BID) | ORAL | Status: DC
  Administered 2015-04-07 – 2015-04-09 (×6): 325 mg via ORAL
  Filled 2015-04-07 (×6): qty 1

## 2015-04-07 MED ORDER — ONDANSETRON HCL 4 MG/2ML IJ SOLN: 4.0000 mg | INTRAMUSCULAR | Status: DC | PRN

## 2015-04-07 MED ORDER — LANOLIN HYDROUS EX OINT: TOPICAL_OINTMENT | CUTANEOUS | Status: DC | PRN

## 2015-04-07 MED ORDER — SIMETHICONE 80 MG PO CHEW: 80.0000 mg | CHEWABLE_TABLET | ORAL | Status: DC | PRN

## 2015-04-07 MED ORDER — ACETAMINOPHEN 325 MG PO TABS: 650.0000 mg | ORAL_TABLET | ORAL | Status: DC | PRN

## 2015-04-07 NOTE — Progress Notes (Signed)
Pt reporting stinging with voiding on R labia- water with peri bottle provided, and pt instructed to use with voiding. Pt voiding with difficulty due to discomfort. States she has had a history of difficulty voiding - where at times she has to strain to void- has not discussed with her Dr. Andrey CotaStates she does not have to strain at present- but uncomfortable with voiding due to burning on R labia. Pt provided with ice pad with some relief, and returned to bed in no distress re urge to void.

## 2015-04-07 NOTE — Progress Notes (Signed)
VSS. Pt had 1 elevated bp of 144/90 around lunch time, recheck this evening was 134/87. Taking norco for pain. Bonding well with infant. Bottlefeeding infant but has asked about breastfeeding. Dr. Suzie PortelaMoffitt has stated the pt could breastfeed. Lactation consultant has offered to help the pt but she has denied it due to visitors. Social Worker consult ordered.  Ruta HindsKelly Macy Polio, RN

## 2015-04-07 NOTE — Progress Notes (Signed)
Patient ID: REBECKAH MASIH, female   DOB: [birth year], 29 y.o.   MRN: [id number] Obstetric Postpartum Daily Progress Note Subjective:  29 y.o. [id number] is PPD#0 status post vaginal delivery.  She is ambulating, is tolerating po, is voiding spontaneously.  Her pain is well controlled on PO pain medications. Her lochia is less than menses.  Denies headache, visual disturbances, and RUQ pain.  Drug screen rechecked and this time was negative for amphetamines.   Medications SCHEDULED MEDICATIONS  . buprenorphine  4 mg Sublingual BID  . ferrous sulfate  325 mg Oral BID WC  . prenatal multivitamin  1 tablet Oral Q1200  . [START ON 04/08/2015] senna-docusate  2 tablet Oral Q24H    MEDICATION INFUSIONS  . oxytocin 40 units in LR 1000 mL 500 mL (04/07/15 0128)  . oxytocin 40 units in LR 1000 mL 62.5 mL/hr (04/07/15 0541)    PRN MEDICATIONS  acetaminophen, witch hazel-glycerin **AND** dibucaine, diphenhydrAMINE, HYDROcodone-acetaminophen, lanolin, measles, mumps and rubella vaccine, ondansetron **OR** ondansetron (ZOFRAN) IV, oxytocin 40 units in LR 1000 mL, simethicone    Objective:   Filed Vitals:   04/07/15 0310 04/07/15 0311 04/07/15 0404 04/07/15 0834  BP:  133/79 122/76 143/66  Pulse:  65 62 66  Temp:   98.1 F (36.7 C) 98 F (36.7 C)  TempSrc:   Oral Oral  Resp: 20  20 20   Height:      Weight:        Current Vital Signs 24h Vital Sign Ranges  T 98 F (36.7 C) Temp  Avg: 98.2 F (36.8 C)  Min: 98 F (36.7 C)  Max: 98.6 F (37 C)  BP (!) 143/66 mmHg BP  Min: 111/62  Max: 155/104  HR 66 Pulse  Avg: 80.8  Min: 60  Max: 158  RR 20 Resp  Avg: 19.2  Min: 16  Max: 22  SaO2     No Data Recorded       24 Hour I/O Current Shift I/O  Time Ins Outs 07/16 0701 - 07/17 0700 In: 600 [I.V.:600] Out: 350  07/17 0701 - 07/17 1900 In: 269 [I.V.:269] Out: -   General: NAD Pulmonary: no increased work of breathing Abdomen: non-distended, non-tender, fundus firm at level of  umbilicus Extremities: no edema, no erythema, no tenderness  Labs:   Recent Labs Lab 04/05/15 2324 04/07/15 0031 04/07/15 0525  WBC 9.6 13.0* 14.8*  HGB 12.9 14.3 13.0  HCT 37.9 41.2 37.8  PLT 159 155 158     Assessment:   29 y.o. [id number] postpartum day # 0 s/p SVD after induction of labor due to IUGR.  Pregnancy complicated by hepatitis C, polysubstance abuse on subutex (followed at [facility]).  Plan:   1) hepatitis C: plans to breast feed.  Infant care per pediatrics protocol.  2) elevated BPs: continue to monitor. No current symptoms.  3) polysubstance abuse: Continue subutex. May have norco for breakthrough pain, but is doing well so far.  4) Conflict (See Lab Report): O POS/O POS / Rubella Nonimmune (02/08 0000) MMR ordered / Varicella Immune  5) TDAP status received 03/26/15  6) breast /Contraception = nexplanon  7) Disposition: home postpartum day #1 or 2.  Will Bonnet, MD, [place] 04/07/2015 11:33 AM

## 2015-04-07 NOTE — Discharge Summary (Signed)
Obstetrical Discharge Summary  Date of Admission: 04/05/2015 Date of Discharge: 04/09/2015 Primary OB: [facility]  Gestational Age at Delivery: 5220w1d   Antepartum complications: intrauterine growth restriction and polysubstance abuse (on subutex), hepatitis C. Reason for Admission: Scheduled induction of labor for fetal growth restriction Date of Delivery: 04/09/2015   Delivered By: Thomasene MohairStephen Jackson, MD Delivery Type: spontaneous vaginal delivery Intrapartum complications/course: None Anesthesia: none Placenta: spontaneous, to pathology: No. Laceration: none Episiotomy: none Baby: Liveborn female, APGARs 8/9, weight 2,230 g.   Postpartum course: benign postpartum course.  Disposition: Home  Rh Immune globulin given: not applicable Rubella vaccine given: no, allergic to neosporin Tdap vaccine given in AP or PP setting: yes Flu vaccine given in AP or PP setting: no  Contraception: Plans nexplanon  Prenatal/Postnatal Panel: O POS//Rubella  Not immune//Varicella Immune//RPR negative//HIV negative/HepB Surface Ag negative//plans to breastfeed  Plan:  Laurie Oliver was discharged to home in good condition. Follow-up appointment with Thomasene MohairStephen Jackson, MD in 6 weeks  Discharge Medications:   Medication List    TAKE these medications        benzocaine-Menthol 20-0.5 % Aero  Commonly known as:  DERMOPLAST  Apply 1 application topically 4 (four) times daily as needed for irritation (apply to perineum prn discomfort).     buprenorphine 8 MG Subl SL tablet  Commonly known as:  SUBUTEX  Place 4 mg under the tongue 2 (two) times daily.     HYDROcodone-acetaminophen 5-325 MG per tablet  Commonly known as:  NORCO/VICODIN  Take 1 tablet by mouth every 6 (six) hours as needed for moderate pain.     prenatal multivitamin Tabs tablet  Take 1 tablet by mouth daily.     ranitidine 150 MG tablet  Commonly known as:  ZANTAC  Take 150 mg by mouth 2 (two) times daily.         Farrel ConnersGUTIERREZ, Sophie Quiles, CNM  04/09/2015 1106

## 2015-04-08 MED ORDER — NICOTINE 21 MG/24HR TD PT24
21.0000 mg | MEDICATED_PATCH | Freq: Every day | TRANSDERMAL | Status: DC
  Administered 2015-04-08 – 2015-04-09 (×2): 21 mg via TRANSDERMAL
  Filled 2015-04-08 (×2): qty 1

## 2015-04-08 MED ORDER — NICOTINE 7 MG/24HR TD PT24
7.0000 mg | MEDICATED_PATCH | Freq: Every day | TRANSDERMAL | Status: DC
  Administered 2015-04-08: 7 mg via TRANSDERMAL
  Filled 2015-04-08: qty 1

## 2015-04-08 NOTE — Progress Notes (Signed)
  Postpartum Day 1  Subjective: Mildly dizzy with ambulating, no major complaints otherwise  Objective: Blood pressure 103/61, pulse 79, temperature 98.7 F (37.1 C), temperature source Oral, resp. rate 20, height 5' (1.524 m), weight 140 lb (63.504 kg), last menstrual period 07/07/2014, SpO2 99 %, unknown if currently breastfeeding.  Physical Exam:  General: alert Lochia: appropriate Uterine Fundus: firm Incision: N/A DVT Evaluation: No evidence of DVT seen on physical exam. Abdomen: soft, NT   Recent Labs  04/07/15 0031 04/07/15 0525  HGB 14.3 13.0  HCT 41.2 37.8    Assessment PPD #1, Hep C positive, Subutex use  Plan: Discharge tomorrow and Continue PP care  Infant possibly will have to stay for prolonged NAS scoring  Feeding: breast & bottle Contraception: Nexplanon RNI (per pharmacy, unable to given MMR d/t neomycin allergy, order cancelled/VI TDAP up to date Blood Type: Marcene Duos, CNM 04/08/2015, 9:51 AM

## 2015-04-09 MED ORDER — HYDROCODONE-ACETAMINOPHEN 5-325 MG PO TABS: 1.0000 | ORAL_TABLET | Freq: Four times a day (QID) | ORAL | Status: DC | PRN

## 2015-04-09 MED ORDER — BENZOCAINE-MENTHOL 20-0.5 % EX AERO: 1.0000 "application " | INHALATION_SPRAY | Freq: Four times a day (QID) | CUTANEOUS | Status: DC | PRN

## 2015-04-09 NOTE — Clinical Social Work Maternal (Signed)
Late entry on 01/08/15 for 01/07/15  CLINICAL SOCIAL WORK MATERNAL/CHILD NOTE  Patient Details  Name: Laurie Oliver MRN: [id number] Date of Birth: 1985/11/22  Date:  04/09/2015  Clinical Social Worker Initiating Note:  Laurie Oliver, [place] Date/ Time Initiated:  04/08/15/1630     Child's Name:  Laurie Oliver   Legal Guardian:  Mother   Need for Interpreter:  None   Date of Referral:  04/08/15     Reason for Referral:  Current Substance Use/Substance Use During Pregnancy    Referral Source:  Physician   Address:  [place]   Phone number:  [phone]   Household Members:  Spouse   Natural Supports (not living in the home):  Immediate Family, Spouse/significant other Laurie Oliver/age 74  Professional Supports: Case Manager/Social Worker (Laurie Oliver - [facility])   Employment: Unemployed   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 )   Cultural/Religious Considerations Which May Impact Care:  None known  Strengths:  Ability to meet basic needs , Home prepared for child , Understanding of illness, Pediatrician chosen    Risk Factors/Current Problems:  Adjustment to Illness    Cognitive State:  Alert    Mood/Affect:  Anxious    CSW Assessment: Clinical Education officer, museum received consult due to positive UDS in [name] for amphetamines and opiates. CSW met with [name] alone in her room with baby at bedside.  Pt is married to FOB and this is their first child. Pt shares that she and FOB live in a home alone. [name] was working at [employer] from December until April of this year when she was fired due to missing work related to her medical treatment. 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 FOB works for an [employer] and is currently laid off until next project begins, he  has been in this position for 15 years. [name] shares that she had been dealing with chronic pain issues related to the treatment of an abscess since 2014. Pt states that she became pregnant after a year or more on narcotics. She states that she was told not to go off of them at this point and to start suboxone. Pt did not start at [facility] until January due to [facility] issues. She states that she goes to the groups 3x a week at [facility] as well which is a requirement for the program. Pt minimizes drug use due to the pain she was experiencing. Pt states that she has benefited some from the groups in dealing with "life problems." Pt is aware of +amphetamine test, asked for a 2nd test which came back negative. Per medical chart review, Pt has had +UDS for amphetamine in remote history but as also prescribed inhalers during that time.  +UDS not done on baby. Per Pt's request, CSW notified [facility] of missing groups due to delivery of baby. [name] has hx of THC use, last use early in pregnancy per Pt. OB breast feeding. She is appropriate with baby and takes primary responsibility for baby's needs in the room. [name] does discuss feeling "guilty for what the baby is going through." CSW discussed post partum depression, including symptoms and follow up at [facility] if she notices any new negative symptoms.   [name] and baby have access to transportation for medical appts. Follow up is likely  Laurie Oliver per Pt.   CSW will continue to follow up and support as necessary.   CSW Plan/Description:  Psychosocial Support and Ongoing Assessment of Needs; Continue to follow until discharge    Laurie Buckler, LCSW 04/09/2015, 10:24 PM

## 2015-04-09 NOTE — Discharge Instructions (Signed)
Vaginal Delivery, Care After Refer to this sheet in the next few weeks. These discharge instructions provide you with information on caring for yourself after delivery. Your caregiver may also give you specific instructions. Your treatment has been planned according to the most current medical practices available, but problems sometimes occur. Call your caregiver if you have any problems or questions after you go home. HOME CARE INSTRUCTIONS  Take over-the-counter or prescription medicines only as directed by your caregiver or pharmacist.  Do not drink alcohol, especially if you are breastfeeding or taking medicine to relieve pain.  Do not smoke tobacco.  Continue to use good perineal care. Good perineal care includes:  Wiping your perineum from back to front  Keeping your perineum clean.  You can do sitz baths twice a day, to help keep this area clean and soothe the perineum  Do not use tampons or have sex until your caregiver says it is okay.  Shower only and avoid sitting in submerged water, aside from sitz baths  Wear a well-fitting bra that provides breast support.  Eat healthy foods.  Drink enough fluids to keep your urine clear or pale yellow.  Eat high-fiber foods such as whole grain cereals and breads, brown rice, beans, and fresh fruits and vegetables every day. These foods may help prevent or relieve constipation.  Avoid constipation with high fiber foods or medications, such as miralax or metamucil  Follow your caregiver's recommendations regarding resumption of activities such as climbing stairs, driving, lifting, exercising, or traveling.  Talk to your caregiver about resuming sexual activities. Resumption of sexual activities is dependent upon your risk of infection, your rate of healing, and your comfort and desire to resume sexual activity.  Try to have someone help you with your household activities and your newborn for at least a few days after you leave the  hospital.  Rest as much as possible. Try to rest or take a nap when your newborn is sleeping.  Increase your activities gradually.  Keep all of your scheduled postpartum appointments. It is very important to keep your scheduled follow-up appointments. At these appointments, your caregiver will be checking to make sure that you are healing physically and emotionally. SEEK MEDICAL CARE IF:   You are passing large clots from your vagina. Save any clots to show your caregiver.  You have a foul smelling discharge from your vagina.  You have trouble urinating.  You are urinating frequently.  You have pain when you urinate.  You have a change in your bowel movements.  You have increasing redness, pain, or swelling near your vaginal incision (episiotomy) or vaginal tear.  You have pus draining from your episiotomy or vaginal tear.  Your episiotomy or vaginal tear is separating.  You have painful, hard, or reddened breasts.  You have a severe headache.  You have blurred vision or see spots.  You feel sad or depressed.  You have thoughts of hurting yourself or your newborn.  You have questions about your care, the care of your newborn, or medicines.  You are dizzy or light-headed.  You have a rash.  You have nausea or vomiting.  You were breastfeeding and have not had a menstrual period within 12 weeks after you stopped breastfeeding.  You are not breastfeeding and have not had a menstrual period by the 12th week after delivery.  You have a fever. SEEK IMMEDIATE MEDICAL CARE IF:   You have persistent pain.  You have chest pain.  You have  shortness of breath.  You faint.  You have leg pain.  You have stomach pain.  Your vaginal bleeding saturates two or more sanitary pads in 1 hour. MAKE SURE YOU:   Understand these instructions.  Will watch your condition.  Will get help right away if you are not doing well or get worse. Document Released: 09/04/2000  Document Revised: 01/22/2014 Document Reviewed: 05/04/2012 [facility] Patient Information 2015 [place], [place]. This information is not intended to replace advice given to you by your health care provider. Make sure you discuss any questions you have with your health care provider.  Sitz Bath A sitz bath is a warm water bath taken in the sitting position. The water covers only the hips and butt (buttocks). We recommend using one that fits in the toilet, to help with ease of use and cleanliness. It may be used for either healing or cleaning purposes. Sitz baths are also used to relieve pain, itching, or muscle tightening (spasms). The water may contain medicine. Moist heat will help you heal and relax.  HOME CARE  Take 3 to 4 sitz baths a day.  Fill the bathtub half-full with warm water.  Sit in the water and open the drain a little.  Turn on the warm water to keep the tub half-full. Keep the water running constantly.  Soak in the water for 15 to 20 minutes.  After the sitz bath, pat the affected area dry. GET HELP RIGHT AWAY IF: You get worse instead of better. Stop the sitz baths if you get worse. MAKE SURE YOU:  Understand these instructions.  Will watch your condition.  Will get help right away if you are not doing well or get worse. Document Released: 10/15/2004 Document Revised: 06/01/2012 Document Reviewed: 01/05/2011 [facility] Patient Information 2015 [place], [place]. This information is not intended to replace advice given to you by your health care provider. Make sure you discuss any questions you have with your health care provider.   Breast Pumping Tips If you are breastfeeding, there may be times when you cannot feed your baby directly. Returning to work or going on a trip are examples. Pumping allows you to store breast milk and feed it to your baby later.  You may not get much milk when you first start to pump. Your breasts should start to make more after a few days. If you  pump at the times you usually feed your baby, you may be able to keep making enough milk to feed your baby without also using formula. The more often you pump, the more milk your body will make. WHEN SHOULD I PUMP?   You can start to pump soon after you have your baby. Ask your doctor what is right for you and your baby.  If you are going back to work, start pumping a few weeks before. This gives you time to learn how to pump and to store a supply of milk.  When you are with your baby, feed your baby when he or she is hungry. Pump after each feeding.  When you are away from your baby for many hours, pump for about 15 minutes every 2-3 hours. Pump both breasts at the same time if you can.  If your baby has a formula feeding, make sure to pump close to the same time.  If you drink any alcohol, wait 2 hours before pumping. HOW DO I GET READY TO PUMP? Your let-down reflex is your body's natural reaction that makes your breast  milk flow. It is easier to make your breast milk flow when you are relaxed. Try these things to help you relax:  Smell one of your baby's blankets or an item of clothing.  Look at a picture or video of your baby.  Sit in a quiet, private space.  Massage the breast you plan to pump.  Place soothing warmth on the breast.  Play relaxing music. WHAT ARE SOME BREAST PUMPING TIPS?  Wash your hands before you pump. You do not need to wash your nipples or breasts.  There are three ways to pump. You can:  Use your hand to massage and squeeze your breast.  Use a handheld manual pump.  Use an electric pump.  Make sure the suction cup on the breast pump is the right size. Place the suction cup directly over the nipple. It can be painful or hurt your nipple if it is the wrong size or placed wrong.  Put a small amount of purified or modified lanolin on your nipple and areola if you are sore.  If you are using an electric pump, change the speed and suction power to be  more comfortable.  You may need a different type of pump if pumping hurts or you do not get a lot of milk. Your doctor can help you pick what type of pump to use.  Keep a full water bottle near you always. Drinking lots of fluid helps you make more milk.  You can store your milk to use later. Pumped breast milk can be stored in a sealable, sterile container or plastic bag. Always put the date you pumped it on the container.  Milk can stay out at room temperature for up to 8 hours.  You can store your milk in the refrigerator for up to 8 days.  You can store your milk in the freezer for 3 months. Thaw frozen milk using warm water. Do not put it in the microwave.  Do not smoke. Ask your doctor for help. WHEN SHOULD I CALL MY DOCTOR?  You have a hard time pumping.  You are worried you do not make enough milk.  You have nipple pain, soreness, or redness.  You want to take birth control pills. Document Released: 02/24/2008 Document Revised: 09/12/2013 Document Reviewed: 06/30/2013 [facility] Patient Information 2015 [place]. This information is not intended to replace advice given to you by your health care provider. Make sure you discuss any questions you have with your health care provider.

## 2015-04-09 NOTE — Progress Notes (Signed)
Post Partum Day2 Subjective: complains of cramping and shooting pains in pelvis as well as tender perineum. Baby will be staying for observation for NAS  Objective: Blood pressure 94/58, pulse 79, temperature 98.1 F (36.7 C), temperature source Oral, resp. rate 18, height 5' (1.524 m), weight 140 lb (63.504 kg), last menstrual period 07/07/2014, SpO2 99 %, unknown if currently breastfeeding.  Physical Exam:  General: sleepy Lochia: appropriate, lochia serosa Uterine Fundus: firm/ U-2/ML NO Incision: perineum intact, but tender to the slightest touch DVT Evaluation: No evidence of DVT seen on physical exam.   Recent Labs  04/07/15 0031 04/07/15 0525  HGB 14.3 13.0  HCT 41.2 37.8  WBC 13.0* 14.8*  PLT 155 158    Assessment/Plan: Stable ppd #2 Discharge home with instructions Plans Nexplanon-encouraged to come by office to order Recommend sitz baths and continued use of dermaplast for perineal pain. GI consult for hepatitis C -will have office schedule OP appt FU with [facility] for subutex    LOS: 4 days   Timberlyn Pickford 04/09/2015, 11:17 AM

## 2015-04-09 NOTE — Progress Notes (Signed)
CSW met and assessed Pt. No concerns for dc. No CPS report needed at this time. Pt appropriate and engaged with CSW. Full assessment to follow.   Toma Copier, [place]

## 2015-04-09 NOTE — H&P (Signed)
H&P From 04/05/2015 IUP @ 38wks admitted for IOL for FGR; see clinic H&P. Patient admitted for o/n cervical ripening

## 2015-04-09 NOTE — Progress Notes (Signed)
Discharge instructions and prescriptions reviewed with patient and her spouse. Patient verbalized understanding. Patient moved to room 336 in order to room in with infant.

## 2015-10-22 ENCOUNTER — Emergency Department
Admission: EM | Admit: 2015-10-22 | Discharge: 2015-10-22 | Disposition: A | Discharge status: Home / Self Care | Payer: Medicaid Other | Attending: Emergency Medicine | Admitting: Emergency Medicine

## 2015-10-22 ENCOUNTER — Encounter: Payer: Self-pay | Admitting: Emergency Medicine

## 2015-10-22 ENCOUNTER — Emergency Department: Payer: Medicaid Other

## 2015-10-22 DIAGNOSIS — R1032 Left lower quadrant pain: Secondary | ICD-10-CM | POA: Diagnosis present

## 2015-10-22 DIAGNOSIS — Z3202 Encounter for pregnancy test, result negative: Secondary | ICD-10-CM | POA: Diagnosis not present

## 2015-10-22 DIAGNOSIS — F17210 Nicotine dependence, cigarettes, uncomplicated: Secondary | ICD-10-CM | POA: Diagnosis not present

## 2015-10-22 DIAGNOSIS — R1030 Lower abdominal pain, unspecified: Secondary | ICD-10-CM

## 2015-10-22 DIAGNOSIS — Z79899 Other long term (current) drug therapy: Secondary | ICD-10-CM | POA: Diagnosis not present

## 2015-10-22 DIAGNOSIS — N83201 Unspecified ovarian cyst, right side: Secondary | ICD-10-CM | POA: Insufficient documentation

## 2015-10-22 LAB — URINALYSIS COMPLETE WITH MICROSCOPIC (ARMC ONLY)
BILIRUBIN URINE: NEGATIVE
GLUCOSE, UA: NEGATIVE mg/dL
KETONES UR: NEGATIVE mg/dL
Leukocytes, UA: NEGATIVE
Nitrite: NEGATIVE
Protein, ur: NEGATIVE mg/dL
SPECIFIC GRAVITY, URINE: 1.015 (ref 1.005–1.030)
pH: 6 (ref 5.0–8.0)

## 2015-10-22 LAB — COMPREHENSIVE METABOLIC PANEL
ALT: 102 U/L — AB (ref 14–54)
AST: 44 U/L — ABNORMAL HIGH (ref 15–41)
Albumin: 4.3 g/dL (ref 3.5–5.0)
Alkaline Phosphatase: 76 U/L (ref 38–126)
Anion gap: 6 (ref 5–15)
BILIRUBIN TOTAL: 0.4 mg/dL (ref 0.3–1.2)
BUN: 19 mg/dL (ref 6–20)
CALCIUM: 8.7 mg/dL — AB (ref 8.9–10.3)
CO2: 23 mmol/L (ref 22–32)
CREATININE: 0.84 mg/dL (ref 0.44–1.00)
Chloride: 106 mmol/L (ref 101–111)
GFR calc Af Amer: >60 mL/min (ref >60)
Glucose, Bld: 116 mg/dL — ABNORMAL HIGH (ref 65–99)
Potassium: 3.7 mmol/L (ref 3.5–5.1)
Sodium: 135 mmol/L (ref 135–145)
TOTAL PROTEIN: 7 g/dL (ref 6.5–8.1)

## 2015-10-22 LAB — CHLAMYDIA/NGC RT PCR (ARMC ONLY)
Chlamydia Tr: NOT DETECTED
N GONORRHOEAE: NOT DETECTED

## 2015-10-22 LAB — CBC
HCT: 41.4 % (ref 35.0–47.0)
HEMOGLOBIN: 14.4 g/dL (ref 12.0–16.0)
MCH: 32.5 pg (ref 26.0–34.0)
MCHC: 34.8 g/dL (ref 32.0–36.0)
MCV: 93.4 fL (ref 80.0–100.0)
PLATELETS: 148 10*3/uL — AB (ref 150–440)
RBC: 4.44 MIL/uL (ref 3.80–5.20)
RDW: 13.6 % (ref 11.5–14.5)
WBC: 6.6 10*3/uL (ref 3.6–11.0)

## 2015-10-22 LAB — WET PREP, GENITAL
Sperm: NONE SEEN
Trich, Wet Prep: NONE SEEN
Yeast Wet Prep HPF POC: NONE SEEN

## 2015-10-22 LAB — PREGNANCY, URINE: Preg Test, Ur: NEGATIVE

## 2015-10-22 MED ORDER — METRONIDAZOLE 500 MG PO TABS: 500.0000 mg | ORAL_TABLET | Freq: Two times a day (BID) | ORAL | Status: AC

## 2015-10-22 MED ORDER — METRONIDAZOLE 500 MG PO TABS
500.0000 mg | ORAL_TABLET | Freq: Once | ORAL | Status: AC
  Administered 2015-10-22: 500 mg via ORAL
  Filled 2015-10-22: qty 1

## 2015-10-22 MED ORDER — DOXYCYCLINE HYCLATE 100 MG PO TABS
100.0000 mg | ORAL_TABLET | Freq: Once | ORAL | Status: AC
  Administered 2015-10-22: 100 mg via ORAL
  Filled 2015-10-22: qty 1

## 2015-10-22 MED ORDER — DOXYCYCLINE HYCLATE 50 MG PO CAPS: 100.0000 mg | ORAL_CAPSULE | Freq: Two times a day (BID) | ORAL | Status: DC

## 2015-10-22 MED ORDER — MORPHINE SULFATE (PF) 4 MG/ML IV SOLN
4.0000 mg | Freq: Once | INTRAVENOUS | Status: AC
  Administered 2015-10-22: 4 mg via INTRAVENOUS
  Filled 2015-10-22: qty 1

## 2015-10-22 MED ORDER — MORPHINE SULFATE (PF) 4 MG/ML IV SOLN: 4.0000 mg | Freq: Once | INTRAVENOUS | Status: DC

## 2015-10-22 MED ORDER — DEXTROSE 5 % IV SOLN
1.0000 g | Freq: Once | INTRAVENOUS | Status: AC
  Administered 2015-10-22: 1 g via INTRAVENOUS
  Filled 2015-10-22: qty 10

## 2015-10-22 NOTE — ED Notes (Signed)
Blood drawn and sent to lab. Patient tolerated well.

## 2015-10-22 NOTE — ED Notes (Signed)
Patient transported to Ultrasound 

## 2015-10-22 NOTE — ED Notes (Signed)
Pt presents to ED with c/o heavy bleeding/discharge for the past 4 weeks. Pt states she has a hx of bartholin cyst and in April she had a marsupialization performed. Pt states she feels that her lower abd is distended and she thinks maybe a cyst has moved inside of her due to her symptoms. Pt reports she is on depo.

## 2015-10-22 NOTE — ED Provider Notes (Signed)
-----------------------------------------   8:35 AM on 10/22/2015 -----------------------------------------  Care was assumed from Dr. Zenda Alpers at 7:30 AM pending results of ultrasound. Briefly this is a 30 year old female presenting with abdominal pain and vaginal bleeding found to have some left adnexal tenderness on Dr. Leone Payor pelvic examination. Ultrasound shows a collapsing right-sided ovarian cyst. We'll discharge with Flagyl and doxy for possible pelvic inflammatory disease. The patient is comfortable with the discharge plan. She'll follow-up with OB/GYN. Return precautions discussed.  Gayla Doss, MD 10/22/15 [phone]

## 2015-10-22 NOTE — ED Notes (Signed)
Pt returned to room, resting in bed in no acute distress 

## 2015-10-22 NOTE — ED Notes (Signed)
Pt resting in bed, family at bedside, pt in no acute distress 

## 2015-10-22 NOTE — ED Provider Notes (Signed)
[facility] Emergency Department Provider Note  ____________________________________________  Time seen: Approximately 517 AM  I have reviewed the triage vital signs and the nursing notes.   HISTORY  Chief Complaint Abdominal Pain    HPI Laurie Oliver is a 30 y.o. female who comes into the hospital today with vaginal bleeding and abdominal pain. The patient reports that she has been bleeding for the past month. She reports that she typically gets Depo-Provera shots and it ran out last month. She reports that she does have some history of Bartholin's gland cyst in the past and she has had pain similar to this in the past. The patient reports that her abdominal pain is in her left lower abdomen and shoots over to her right abdomen and into her chest. A shunt reports that she is unable to use a tampon because it seems to go straight back instead of up like it typically does. She also reports that she has pain vaginally and thinks it may be due to a Bartholin's gland cyst. The patient had surgery for her cyst last April and has not had one since then. The patient reports that she was told to see another OB/GYN but she has not seen anyone yet. The patient reports the pain is a 10 out of 10 in intensity and she has not slept in 2 days. She reports that her abdomen is swollen and puffy. She is not taking anything for pain at home.   Past Medical History  Diagnosis Date  . Bartholin's gland abscess   . Bacterial vaginosis   . Hepatitis C     Patient Active Problem List   Diagnosis Date Noted  . Hepatitis C 04/07/2015  . Polysubstance abuse 04/07/2015  . High risk pregnancy due to maternal drug abuse in third trimester 04/07/2015  . Labor and delivery indication for care or intervention 04/07/2015  . Fetal growth restriction 04/05/2015    Past Surgical History  Procedure Laterality Date  .  barholins gland abscess drainage      Current Outpatient Rx  Name   Route  Sig  Dispense  Refill  . benzocaine-Menthol (DERMOPLAST) 20-0.5 % AERO   Topical   Apply 1 application topically 4 (four) times daily as needed for irritation (apply to perineum prn discomfort).         . buprenorphine (SUBUTEX) 8 MG SUBL SL tablet   Sublingual   Place 4 mg under the tongue 2 (two) times daily.          Marland Kitchen HYDROcodone-acetaminophen (NORCO/VICODIN) 5-325 MG per tablet   Oral   Take 1 tablet by mouth every 6 (six) hours as needed for moderate pain.   20 tablet   0   . Prenatal Vit-Fe Fumarate-FA (PRENATAL MULTIVITAMIN) TABS tablet   Oral   Take 1 tablet by mouth daily.         . ranitidine (ZANTAC) 150 MG tablet   Oral   Take 150 mg by mouth 2 (two) times daily.           Allergies Motrin; Aspirin; and Neosporin  No family history on file.  Social History Social History  Substance Use Topics  . Smoking status: Current Every Day Smoker -- 1.00 packs/day    Types: Cigarettes  . Smokeless tobacco: None  . Alcohol Use: No     Comment: Subutex current, Hx of Cannabis and Opioid     Review of Systems Constitutional: No fever/chills Eyes: No visual changes. ENT:  No sore throat. Cardiovascular: Denies chest pain. Respiratory: Denies shortness of breath. Gastrointestinal:  abdominal pain.  No nausea, no vomiting.  No diarrhea.  No constipation. Genitourinary: Negative for dysuria. Musculoskeletal: Negative for back pain. Skin: Negative for rash. Neurological: Negative for headaches, focal weakness or numbness.  10-point ROS otherwise negative.  ____________________________________________   PHYSICAL EXAM:  VITAL SIGNS: ED Triage Vitals  Enc Vitals Group     BP 10/22/15 0446 148/94 mmHg     Pulse Rate 10/22/15 0446 85     Resp 10/22/15 0446 18     Temp 10/22/15 0446 98.3 F (36.8 C)     Temp Source 10/22/15 0446 Oral     SpO2 10/22/15 0446 97 %     Weight 10/22/15 0446 120 lb (54.432 kg)     Height 10/22/15 0446 5' (1.524 m)      Head Cir --      Peak Flow --      Pain Score 10/22/15 0448 10     Pain Loc --      Pain Edu? --      Excl. in GC? --     Constitutional: Alert and oriented. Well appearing and in moderate distress. Eyes: Conjunctivae are normal. PERRL. EOMI. Head: Atraumatic. Nose: No congestion/rhinnorhea. Mouth/Throat: Mucous membranes are moist.  Oropharynx non-erythematous. Cardiovascular: Normal rate, regular rhythm. Grossly normal heart sounds.  Good peripheral circulation. Respiratory: Normal respiratory effort.  No retractions. Lungs CTAB. Gastrointestinal: Soft with lower abd tenderness to palpation. Abd distention. Positive bowel sounds Genitourinary: Normal external genitalia, cervical motion tenderness, Left adnexal and uterine tenderness to palpation Musculoskeletal: No lower extremity tenderness nor edema.   Neurologic:  Normal speech and language.  Skin:  Skin is warm, dry and intact.  Psychiatric: Mood and affect are normal.   ____________________________________________   LABS (all labs ordered are listed, but only abnormal results are displayed)  Labs Reviewed  WET PREP, GENITAL - Abnormal; Notable for the following:    Clue Cells Wet Prep HPF POC PRESENT (*)    WBC, Wet Prep HPF POC MODERATE (*)    All other components within normal limits  URINALYSIS COMPLETEWITH MICROSCOPIC ([facility] ONLY) - Abnormal; Notable for the following:    Color, Urine YELLOW (*)    APPearance CLEAR (*)    Hgb urine dipstick 3+ (*)    Bacteria, UA RARE (*)    Squamous Epithelial / LPF 0-5 (*)    All other components within normal limits  CHLAMYDIA/NGC RT PCR ([facility] ONLY)  PREGNANCY, URINE  CBC  COMPREHENSIVE METABOLIC PANEL   ____________________________________________  EKG  none ____________________________________________  RADIOLOGY  Ultrasound Pelvis: Pending  ____________________________________________   PROCEDURES  Procedure(s) performed: None  Critical Care  performed: No  ____________________________________________   INITIAL IMPRESSION / ASSESSMENT AND PLAN / ED COURSE  Pertinent labs & imaging results that were available during my care of the patient were reviewed by me and considered in my medical decision making (see chart for details).  This is a 30 year old female who comes in to the hospital with some vaginal bleeding and abdominal pain. The patient is also having some abdominal distention. The patient does have cervical motion tenderness and some abdominal distention and pain. I will send the patient for an ultrasound to evaluate possible cyst versus tubo-ovarian abscess. The patient's blood work looks unremarkable but she does have some clue cells in her wet prep. The patient's GC and chlamydia are still pending at this time as well as her  ultrasound results. The patient did receive a dose of morphine for her pain. Her care will be signed out to Dr. Inocencio Homes who will follow-up the results of the patient's ultrasound. The plan is to treat the patient for PID if her ultrasound is unremarkable. ____________________________________________   FINAL CLINICAL IMPRESSION(S) / ED DIAGNOSES  Final diagnoses:  Lower abdominal pain      Rebecka Apley, MD 10/22/15 [phone]

## 2016-02-29 ENCOUNTER — Encounter: Payer: Self-pay | Admitting: Emergency Medicine

## 2016-02-29 DIAGNOSIS — R109 Unspecified abdominal pain: Secondary | ICD-10-CM | POA: Insufficient documentation

## 2016-02-29 DIAGNOSIS — Z5321 Procedure and treatment not carried out due to patient leaving prior to being seen by health care provider: Secondary | ICD-10-CM | POA: Diagnosis not present

## 2016-02-29 LAB — POCT PREGNANCY, URINE: PREG TEST UR: NEGATIVE

## 2016-02-29 NOTE — ED Notes (Addendum)
Pt reports all over abd pain for the last 3 weeks. Pt reports she took home pregnancy test that was negative. Pt reports nausea with the pain but denies vomiting/diarrhea. Pt reports frequent urination but denies burning. Pt denies vaginal discharge. Pt does report she is Hep C positive.

## 2016-03-01 ENCOUNTER — Emergency Department
Admission: EM | Admit: 2016-03-01 | Discharge: 2016-03-01 | Disposition: A | Discharge status: Home / Self Care | Payer: Medicaid Other | Attending: Emergency Medicine | Admitting: Emergency Medicine

## 2016-03-01 HISTORY — DX: Cardiac murmur, unspecified: R01.1

## 2016-03-01 LAB — URINALYSIS COMPLETE WITH MICROSCOPIC (ARMC ONLY)
Bilirubin Urine: NEGATIVE
GLUCOSE, UA: NEGATIVE mg/dL
HGB URINE DIPSTICK: NEGATIVE
KETONES UR: NEGATIVE mg/dL
LEUKOCYTES UA: NEGATIVE
NITRITE: NEGATIVE
Protein, ur: NEGATIVE mg/dL
Specific Gravity, Urine: 1.013 (ref 1.005–1.030)
pH: 7 (ref 5.0–8.0)

## 2016-03-01 LAB — COMPREHENSIVE METABOLIC PANEL
ALK PHOS: 69 U/L (ref 38–126)
ALT: 199 U/L — ABNORMAL HIGH (ref 14–54)
ANION GAP: 6 (ref 5–15)
AST: 93 U/L — ABNORMAL HIGH (ref 15–41)
Albumin: 4.7 g/dL (ref 3.5–5.0)
BUN: 23 mg/dL — ABNORMAL HIGH (ref 6–20)
CO2: 28 mmol/L (ref 22–32)
Calcium: 9.4 mg/dL (ref 8.9–10.3)
Chloride: 105 mmol/L (ref 101–111)
Creatinine, Ser: 0.91 mg/dL (ref 0.44–1.00)
GFR calc Af Amer: >60 mL/min (ref >60)
Glucose, Bld: 79 mg/dL (ref 65–99)
POTASSIUM: 4.5 mmol/L (ref 3.5–5.1)
Sodium: 139 mmol/L (ref 135–145)
Total Bilirubin: 0.4 mg/dL (ref 0.3–1.2)
Total Protein: 7.6 g/dL (ref 6.5–8.1)

## 2016-03-01 LAB — CBC
HEMATOCRIT: 42.1 % (ref 35.0–47.0)
HEMOGLOBIN: 14.5 g/dL (ref 12.0–16.0)
MCH: 33 pg (ref 26.0–34.0)
MCHC: 34.4 g/dL (ref 32.0–36.0)
MCV: 95.9 fL (ref 80.0–100.0)
Platelets: 134 10*3/uL — ABNORMAL LOW (ref 150–440)
RBC: 4.39 MIL/uL (ref 3.80–5.20)
RDW: 12.9 % (ref 11.5–14.5)
WBC: 5.5 10*3/uL (ref 3.6–11.0)

## 2016-03-01 LAB — LIPASE, BLOOD: Lipase: 33 U/L (ref 11–51)

## 2016-03-01 NOTE — ED Notes (Signed)
No answer when called for treatment room.  No one in waiting room bathroom and patient not seen in the lobby.

## 2016-05-24 ENCOUNTER — Emergency Department: Payer: Medicaid Other

## 2016-05-24 ENCOUNTER — Emergency Department
Admission: EM | Admit: 2016-05-24 | Discharge: 2016-05-24 | Disposition: A | Discharge status: Home / Self Care | Payer: Medicaid Other | Attending: Emergency Medicine | Admitting: Emergency Medicine

## 2016-05-24 ENCOUNTER — Encounter: Payer: Self-pay | Admitting: Emergency Medicine

## 2016-05-24 DIAGNOSIS — R102 Pelvic and perineal pain: Secondary | ICD-10-CM

## 2016-05-24 DIAGNOSIS — N8110 Cystocele, unspecified: Secondary | ICD-10-CM | POA: Diagnosis not present

## 2016-05-24 DIAGNOSIS — F17210 Nicotine dependence, cigarettes, uncomplicated: Secondary | ICD-10-CM | POA: Diagnosis not present

## 2016-05-24 DIAGNOSIS — IMO0002 Reserved for concepts with insufficient information to code with codable children: Secondary | ICD-10-CM

## 2016-05-24 LAB — COMPREHENSIVE METABOLIC PANEL
ALBUMIN: 5 g/dL (ref 3.5–5.0)
ALK PHOS: 87 U/L (ref 38–126)
ALT: 151 U/L — ABNORMAL HIGH (ref 14–54)
AST: 92 U/L — AB (ref 15–41)
Anion gap: 7 (ref 5–15)
BILIRUBIN TOTAL: 0.7 mg/dL (ref 0.3–1.2)
BUN: 18 mg/dL (ref 6–20)
CO2: 30 mmol/L (ref 22–32)
Calcium: 9.5 mg/dL (ref 8.9–10.3)
Chloride: 101 mmol/L (ref 101–111)
Creatinine, Ser: 0.69 mg/dL (ref 0.44–1.00)
GFR calc Af Amer: >60 mL/min (ref >60)
GFR calc non Af Amer: >60 mL/min (ref >60)
GLUCOSE: 95 mg/dL (ref 65–99)
POTASSIUM: 4.2 mmol/L (ref 3.5–5.1)
Sodium: 138 mmol/L (ref 135–145)
TOTAL PROTEIN: 8.4 g/dL — AB (ref 6.5–8.1)

## 2016-05-24 LAB — URINALYSIS COMPLETE WITH MICROSCOPIC (ARMC ONLY)
BACTERIA UA: NONE SEEN
Bilirubin Urine: NEGATIVE
GLUCOSE, UA: NEGATIVE mg/dL
Hgb urine dipstick: NEGATIVE
Ketones, ur: NEGATIVE mg/dL
Leukocytes, UA: NEGATIVE
NITRITE: NEGATIVE
Protein, ur: 100 mg/dL — AB
SPECIFIC GRAVITY, URINE: 1.024 (ref 1.005–1.030)
pH: 8 (ref 5.0–8.0)

## 2016-05-24 LAB — CBC
HEMATOCRIT: 46 % (ref 35.0–47.0)
Hemoglobin: 16.4 g/dL — ABNORMAL HIGH (ref 12.0–16.0)
MCH: 33.7 pg (ref 26.0–34.0)
MCHC: 35.6 g/dL (ref 32.0–36.0)
MCV: 94.6 fL (ref 80.0–100.0)
Platelets: 169 10*3/uL (ref 150–440)
RBC: 4.86 MIL/uL (ref 3.80–5.20)
RDW: 13.5 % (ref 11.5–14.5)
WBC: 12.4 10*3/uL — ABNORMAL HIGH (ref 3.6–11.0)

## 2016-05-24 LAB — CHLAMYDIA/NGC RT PCR (ARMC ONLY)
Chlamydia Tr: NOT DETECTED
N gonorrhoeae: NOT DETECTED

## 2016-05-24 LAB — WET PREP, GENITAL
CLUE CELLS WET PREP: NONE SEEN
SPERM: NONE SEEN
TRICH WET PREP: NONE SEEN
YEAST WET PREP: NONE SEEN

## 2016-05-24 LAB — POCT PREGNANCY, URINE: Preg Test, Ur: NEGATIVE

## 2016-05-24 MED ORDER — OXYCODONE-ACETAMINOPHEN 5-325 MG PO TABS
2.0000 | ORAL_TABLET | Freq: Once | ORAL | Status: AC
  Administered 2016-05-24: 2 via ORAL
  Filled 2016-05-24: qty 2

## 2016-05-24 MED ORDER — ONDANSETRON 4 MG PO TBDP
4.0000 mg | ORAL_TABLET | Freq: Once | ORAL | Status: AC
Start: 2016-05-24 — End: 2016-05-24
  Administered 2016-05-24: 4 mg via ORAL
  Filled 2016-05-24: qty 1

## 2016-05-24 NOTE — ED Notes (Signed)
Patient states when she went to the bathroom she noticed what she thought was a cyst. Patient states when she stood up it "sucked back up". Family history of bladder wall prolapse.

## 2016-05-24 NOTE — ED Triage Notes (Addendum)
Pt presents to Ed c/o nausea lower abd/pelvic pain possibly related to known cyst. Pt states last night "it looked like a softball", " I could feel it going back in"

## 2016-05-24 NOTE — ED Provider Notes (Signed)
[facility] Emergency Department Provider Note  ____________________________________________   First MD Initiated Contact with Patient 05/24/16 1907     (approximate)  I have reviewed the triage vital signs and the nursing notes.   HISTORY  Chief Complaint Pelvic Pain and Abdominal Pain    HPI Laurie Oliver is a 30 y.o. female who is generally healthy in spite of history of hepatitis C and polysubstance abuse.  She presents for evaluation of some pelvic pain and feeling like something "fell out" of her vagina.  She has been feeling some discomfort during sexual intercourse recently, like her husband is "hitting something".  She went to the bathroom and something that looked like a softball fell out "where my baby came from".  When she stood back up it went back up inside.  She continues to have some discomfort and pain at baseline she describes as aching and down in her pelvis.  She denies fever/chills, chest pain, shortness of breath, nausea, vomiting, diarrhea.  She has not had any vaginal bleeding or dysuria.  She has one child that she delivered vaginally.  Her mother had a bladder wall prolapse and told her that that might be happening to her.   Past Medical History:  Diagnosis Date  . Bacterial vaginosis   . Bartholin's gland abscess   . Heart murmur   . Hepatitis C     Patient Active Problem List   Diagnosis Date Noted  . Hepatitis C 04/07/2015  . Polysubstance abuse 04/07/2015  . High risk pregnancy due to maternal drug abuse in third trimester 04/07/2015  . Labor and delivery indication for care or intervention 04/07/2015  . Fetal growth restriction 04/05/2015    Past Surgical History:  Procedure Laterality Date  .  Barholins gland abscess drainage      Prior to Admission medications   Medication Sig Start Date End Date Taking? Authorizing Provider  benzocaine-Menthol (DERMOPLAST) 20-0.5 % AERO Apply 1 application topically 4 (four)  times daily as needed for irritation (apply to perineum prn discomfort). 04/09/15   Farrel Conners, CNM  buprenorphine (SUBUTEX) 8 MG SUBL SL tablet Place 4 mg under the tongue 2 (two) times daily.     Historical Provider, MD  doxycycline (VIBRAMYCIN) 50 MG capsule Take 2 capsules (100 mg total) by mouth 2 (two) times daily. Dispense QS for 14 days 10/22/15   Gayla Doss, MD  HYDROcodone-acetaminophen (NORCO/VICODIN) 5-325 MG per tablet Take 1 tablet by mouth every 6 (six) hours as needed for moderate pain. 04/09/15   Farrel Conners, CNM  Prenatal Vit-Fe Fumarate-FA (PRENATAL MULTIVITAMIN) TABS tablet Take 1 tablet by mouth daily.    Historical Provider, MD  ranitidine (ZANTAC) 150 MG tablet Take 150 mg by mouth 2 (two) times daily.    Historical Provider, MD    Allergies Motrin [ibuprofen]; Aspirin; and Neosporin [neomycin-bacitracin zn-polymyx]  History reviewed. No pertinent family history.  Social History Social History  Substance Use Topics  . Smoking status: Current Every Day Smoker    Packs/day: 1.00    Types: Cigarettes  . Smokeless tobacco: Not on file  . Alcohol use No     Comment: Subutex current, Hx of Cannabis and Opioid     Review of Systems Constitutional: No fever/chills Eyes: No visual changes. ENT: No sore throat. Cardiovascular: Denies chest pain. Respiratory: Denies shortness of breath. Gastrointestinal: + Lower pelvic pain.  No nausea, no vomiting.  No diarrhea.  No constipation. Genitourinary: Negative for dysuria.  Pelvic  pain and something that "fell out" near her vagina Musculoskeletal: Negative for back pain. Skin: Negative for rash. Neurological: Negative for headaches, focal weakness or numbness.  10-point ROS otherwise negative.  ____________________________________________   PHYSICAL EXAM:  VITAL SIGNS: ED Triage Vitals  Enc Vitals Group     BP 05/24/16 1734 (!) 123/95     Pulse Rate 05/24/16 1734 95     Resp 05/24/16 1734 18      Temp 05/24/16 1734 97.9 F (36.6 C)     Temp Source 05/24/16 1734 Oral     SpO2 05/24/16 1734 100 %     Weight 05/24/16 1734 120 lb (54.4 kg)     Height 05/24/16 1734 5' (1.524 m)     Head Circumference --      Peak Flow --      Pain Score 05/24/16 1735 10     Pain Loc --      Pain Edu? --      Excl. in GC? --     Constitutional: Alert and oriented. Well appearing and in no acute distress. Eyes: Conjunctivae are normal. PERRL. EOMI. Head: Atraumatic. Nose: No congestion/rhinnorhea. Mouth/Throat: Mucous membranes are moist.  Oropharynx non-erythematous. Neck: No stridor.  No meningeal signs.   Cardiovascular: Normal rate, regular rhythm. Good peripheral circulation. Grossly normal heart sounds. Respiratory: Normal respiratory effort.  No retractions. Lungs CTAB. Gastrointestinal: Soft with diffuse mild tenderness to her lower abdomen. Genitourinary: Normal external exam.  There is a moderate amount of discharge in the vaginal vault that appears a dirty brown or black color.  There is some mild cervical motion tenderness.  The cervix is not friable in appearance with no obvious cervicitis but the amount of discharge mixed a difficult to fully appreciate the cervix's appearance.  The patient was very uncomfortable when she was moving downward in the bed and was in tears by the end of the exam. Musculoskeletal: No lower extremity tenderness nor edema. No gross deformities of extremities. Neurologic:  Normal speech and language. No gross focal neurologic deficits are appreciated.  Skin:  Skin is warm, dry and intact. No rash noted.   ____________________________________________   LABS (all labs ordered are listed, but only abnormal results are displayed)  Labs Reviewed  WET PREP, GENITAL - Abnormal; Notable for the following:       Result Value   WBC, Wet Prep HPF POC MODERATE (*)    All other components within normal limits  COMPREHENSIVE METABOLIC PANEL - Abnormal; Notable for the  following:    Total Protein 8.4 (*)    AST 92 (*)    ALT 151 (*)    All other components within normal limits  CBC - Abnormal; Notable for the following:    WBC 12.4 (*)    Hemoglobin 16.4 (*)    All other components within normal limits  URINALYSIS COMPLETEWITH MICROSCOPIC ([facility] ONLY) - Abnormal; Notable for the following:    Color, Urine YELLOW (*)    APPearance CLOUDY (*)    Protein, ur 100 (*)    Squamous Epithelial / LPF TOO NUMEROUS TO COUNT (*)    All other components within normal limits  CHLAMYDIA/NGC RT PCR ([facility] ONLY)  URINE CULTURE  POC URINE PREG, ED  POCT PREGNANCY, URINE   ____________________________________________  EKG  None - EKG not ordered by ED physician ____________________________________________  RADIOLOGY   US Transvaginal Non-ob  Result Date: 05/24/2016 CLINICAL DATA:  Chronic pelvic pain.  Initial encounter. EXAM: TRANSABDOMINAL AND TRANSVAGINAL  ULTRASOUND OF PELVIS TECHNIQUE: Both transabdominal and transvaginal ultrasound examinations of the pelvis were performed. Transabdominal technique was performed for global imaging of the pelvis including uterus, ovaries, adnexal regions, and pelvic cul-de-sac. It was necessary to proceed with endovaginal exam following the transabdominal exam to visualize the ovaries in greater detail. COMPARISON:  Pelvic ultrasound performed 10/22/2015 FINDINGS: Uterus Measurements: 7.0 x 3.5 x 4.5 cm. No fibroids or other mass visualized. Endometrium Thickness: 0.3 cm. There appears to be a partial septate uterus, with septation noted at the level of the fundus. A small calcification is noted along the endometrial echo complex on the right side. Trace fluid is seen within the cervical canal. Right ovary Measurements: 2.4 x 1.7 x 2.4 cm, with a nonspecific 0.7 cm echogenic focus. Left ovary Measurements: 4.4 x 3.1 x 3.4 cm. Two left adnexal cysts are seen, the larger of which measures 3.2 cm in size. Other findings Trace  free fluid is seen within the pelvic cul-de-sac. IMPRESSION: 1. Apparent partial septate uterus, with septation noted at the level of the fundus. Single small focus of calcification along the endometrial complex. Trace fluid within the cervical canal. 2. No evidence for ovarian torsion. No definite evidence for tubo-ovarian abscess or pelvic inflammatory disease. 3. Nonspecific 0.7 cm echogenic focus noted within the right ovary. The right ovary is otherwise unremarkable. 4. Simple left adnexal cysts measure up to 3.2 cm in size. Electronically Signed   By: Roanna RaiderJeffery  Chang M.D.   On: 05/24/2016 22:40   [place] Pelvis Complete  Result Date: 05/24/2016 CLINICAL DATA:  Chronic pelvic pain.  Initial encounter. EXAM: TRANSABDOMINAL AND TRANSVAGINAL ULTRASOUND OF PELVIS TECHNIQUE: Both transabdominal and transvaginal ultrasound examinations of the pelvis were performed. Transabdominal technique was performed for global imaging of the pelvis including uterus, ovaries, adnexal regions, and pelvic cul-de-sac. It was necessary to proceed with endovaginal exam following the transabdominal exam to visualize the ovaries in greater detail. COMPARISON:  Pelvic ultrasound performed 10/22/2015 FINDINGS: Uterus Measurements: 7.0 x 3.5 x 4.5 cm. No fibroids or other mass visualized. Endometrium Thickness: 0.3 cm. There appears to be a partial septate uterus, with septation noted at the level of the fundus. A small calcification is noted along the endometrial echo complex on the right side. Trace fluid is seen within the cervical canal. Right ovary Measurements: 2.4 x 1.7 x 2.4 cm, with a nonspecific 0.7 cm echogenic focus. Left ovary Measurements: 4.4 x 3.1 x 3.4 cm. Two left adnexal cysts are seen, the larger of which measures 3.2 cm in size. Other findings Trace free fluid is seen within the pelvic cul-de-sac. IMPRESSION: 1. Apparent partial septate uterus, with septation noted at the level of the fundus. Single small focus of  calcification along the endometrial complex. Trace fluid within the cervical canal. 2. No evidence for ovarian torsion. No definite evidence for tubo-ovarian abscess or pelvic inflammatory disease. 3. Nonspecific 0.7 cm echogenic focus noted within the right ovary. The right ovary is otherwise unremarkable. 4. Simple left adnexal cysts measure up to 3.2 cm in size. Electronically Signed   By: Roanna RaiderJeffery  Chang M.D.   On: 05/24/2016 22:40    ____________________________________________   PROCEDURES  Procedure(s) performed:   Procedures   Critical Care performed: No ____________________________________________   INITIAL IMPRESSION / ASSESSMENT AND PLAN / ED COURSE  Pertinent labs & imaging results that were available during my care of the patient were reviewed by me and considered in my medical decision making (see chart for details).  The patient  is describing what sounds like a cystocele.  She is providing a urine sample now and we will perform a pelvic exam for further evaluation.  Her workup thus far is unremarkable with stable vital signs and normal blood work except for a very slight leukocytosis and a very slight elevation of her LFTs in the setting of known hepatitis C.   Clinical Course  Value Comment By Time   Given the amount of discomfort on her exam and her abnormal findings on pelvic exam, I want to proceed with a transvaginal ultrasound to evaluate for possible PID/TOA.  GC/chlamydia pending.  Patient agrees with plan. Loleta Rose, MD 09/03 2043  US Transvaginal Non-OB Ultrasound is unremarkable and gonorrhea and chlamydia tests are negative.  I think that the patient's symptoms are most suggestive of a mitten cystocele.  I will recommend that she follow up with OB/GYN at the next available opportunity.  I have no specific source to treat with antibiotics in spite of a slightly abnormal pelvic exam so I will encourage her to follow up with her OB as soon as possible.  She takes  Suboxone and I cannot prescribe narcotic pain medicine and will encourage her to take over-the-counter medicine. Loleta Rose, MD 09/03 2249    ____________________________________________  FINAL CLINICAL IMPRESSION(S) / ED DIAGNOSES  Final diagnoses:  Pelvic pain in female  Cystocele     MEDICATIONS GIVEN DURING THIS VISIT:  Medications  oxyCODONE-acetaminophen (PERCOCET/ROXICET) 5-325 MG per tablet 2 tablet (2 tablets Oral Given 05/24/16 2023)  ondansetron (ZOFRAN-ODT) disintegrating tablet 4 mg (4 mg Oral Given 05/24/16 2024)     NEW OUTPATIENT MEDICATIONS STARTED DURING THIS VISIT:  Discharge Medication List as of 05/24/2016 10:53 PM      Discharge Medication List as of 05/24/2016 10:53 PM      Discharge Medication List as of 05/24/2016 10:53 PM       Note:  This document was prepared using Dragon voice recognition software and may include unintentional dictation errors.    Loleta Rose, MD 05/24/16 2306

## 2016-05-24 NOTE — Discharge Instructions (Signed)
As we discussed, your workup today was reassuring.  Though we do not know exactly what is causing your symptoms, it appears that you have no emergent medical condition at this time are safe to go home and follow up as recommended in this paperwork.  You may be suffering from an intermittent cystocele (bladder prolapse), and we recommend you follow up with [facility] at the next available opportunity.  Please return immediately to the Emergency Department if you develop any new or worsening symptoms that concern you.

## 2016-05-26 LAB — URINE CULTURE
Culture: <10000 — AB
Special Requests: NORMAL

## 2016-08-21 ENCOUNTER — Ambulatory Visit: Payer: Medicaid Other | Attending: Obstetrics and Gynecology | Admitting: Physical Therapy

## 2016-08-21 DIAGNOSIS — M6281 Muscle weakness (generalized): Secondary | ICD-10-CM | POA: Diagnosis not present

## 2016-08-21 NOTE — Therapy (Addendum)
[facility] [address] [place], [place], [address] Phone: [phone]   Fax:  [phone]  Physical Therapy Evaluation  Patient Details  Name: Laurie IvanoffShannon N Ehrman MRN: [id number] Date of Birth: 10/14/1985 No Data Recorded  Encounter Date: 08/21/2016      PT End of Session - 08/23/16 1652    Visit Number 1   Number of Visits 1   Authorization Type medicaid coverage is limited to one visit, pt unable to pay out of pocket for future visits   PT Start Time 1400   PT Stop Time 1510   PT Time Calculation (min) 70 min   Activity Tolerance Patient tolerated treatment well;No increased pain   Behavior During Therapy WFL for tasks assessed/performed      Past Medical History:  Diagnosis Date  . Bacterial vaginosis   . Bartholin's gland abscess   . Heart murmur   . Hepatitis C     Past Surgical History:  Procedure Laterality Date  .  Barholins gland abscess drainage      There were no vitals filed for this visit.       Subjective Assessment - 08/23/16 1650    Subjective 1) Pt reports bulging of pelvic organ out the vaginal up to 5-6 inches when she is using the restroom for the past 7 months. Pt had her 1st child 03/2015 via vaginal delivery with stitches. Currently, pt reports severe stomach pain in the lower abdomen, pain with sexual intercourse, incomplete emptying of bowels and urine.  Pt has tried to splint with her finger but it does not help. Pt works at [employer] and has to push carts and lift repeated.  2) constipation : Bowel movements occur every 2 days to a week.   3) LBP 10/10 started 1 year post-partum. R radiating pain lateral side to the knee level     Pertinent History ovarian cyst 2/2 endometriosis, has done sit ups and crunches, smoking    Currently in Pain? Yes   Pain Score 10-Worst pain ever   Pain Location Abdomen            [facility] PT Assessment - 08/23/16 1640      Observation/Other  Assessments   Observations hyperextended knees, anterior lordosis      Coordination   Gross Motor Movements are Fluid and Coordinated --  chest breathing   Fine Motor Movements are Fluid and Coordinated --  straining abdomen with cue for pelvic floor     Squat   Comments anterior COM poor alignment     Palpation   Palpation comment pain with palpation over suprapubic area (decreased posTx)                  Pelvic Floor Special Questions - 08/23/16 1643    Prolapse Anterior Wall;Posterior Wall  within introitus with cough   Pelvic Floor Internal Exam pt consented verbally without contraindications   Exam Type Vaginal   Palpation increased tensions/ tenderness at obturator internus B    Strength fair squeeze, definite lift  more cranial position of bladder w. pillow under hips   Strength # of reps 3   Strength # of seconds 10          [facility] Adult PT Treatment/Exercise - 08/23/16 1651      Neuro Re-ed    Neuro Re-ed Details  pelvic floor coordination, less straining to minimize prolapse     Manual Therapy   Manual therapy comments  STM, thiele massage to release mm tension at obt int B                            Plan - 08/23/16 1653    Clinical Impression Statement Pt is a 30 year old pt who c/o abdominal pain, difficulty with completely emptying urine and bowels 2/2 descent of pelvic organs.  Pt also c/o constipation and LBP. These deficits limit her QOL and ADLs. Pt 's clinical presentations showed increased pelvic floor muscle tensions (B obturator internus), poor posture, improper body mechanics that placed additional load onto the pelvic floor, dyscoordination and weakness of pelvic floor mm.  Pt reported less abdominal pain and demo'd proper pelvic floor strengthening technique as well as ways to minimize straining of abdominal and pelvic floor in functional activities. Pt was provided resources to progress her exercises because pt will not be  able to afford out of pocket costs for more PT sessions. Pt is being d/c at this time. PT plans to communicate with referring provider regarding pt benefiting from limited lifting restrictions at work.     Rehab Potential Good   Clinical Impairments Affecting Rehab Potential smoking, heavy lifting at work   PT Frequency One time visit   PT Treatment/Interventions ADLs/Self Care Home Management;Manual techniques;Manual lymph drainage;Patient/family education;Functional mobility training;Moist Heat;Therapeutic exercise;Balance training;Therapeutic activities;Scar mobilization;Neuromuscular re-education   Consulted and Agree with Plan of Care Patient      Patient will benefit from skilled therapeutic intervention in order to improve the following deficits and impairments:  Improper body mechanics, Impaired sensation, Decreased coordination, Increased muscle spasms, Postural dysfunction, Decreased range of motion, Decreased endurance, Decreased activity tolerance, Decreased strength, Decreased safety awareness  Visit Diagnosis: Muscle weakness (generalized)     Problem List Patient Active Problem List   Diagnosis Date Noted  . Hepatitis C 04/07/2015  . Polysubstance abuse 04/07/2015  . High risk pregnancy due to maternal drug abuse in third trimester 04/07/2015  . Labor and delivery indication for care or intervention 04/07/2015  . Fetal growth restriction 04/05/2015    Mariane MastersYeung,Shin Yiing ,PT, DPT, E-RYT  08/23/2016, 5:01 PM  [facility] [address] [place], [place], [address] Phone: [phone]   Fax:  [phone]  Name: Laurie IvanoffShannon N Daddona MRN: [id number] Date of Birth: 09/12/1986

## 2016-08-21 NOTE — Patient Instructions (Addendum)
[email or web address]  Hillis [email or web address] [email or web address]    ___________ Stretch pelvic floor muscles daily 2 x day   Frog stretch: laying on belly with pillow under hips, knees bent, inhale do nothing, exhale let ankles fall apart ~4 10 reps x 3    Stretches for pelvic floor: Laying on your back with a towel under opposite thigh of the thigh you want to stretch Cross __  ankle over __  Thigh  Inhale, feel pelvic floor lengthen/diaphragm expand left and right laterally Exhale, bring thighs closer to chest by pulling towel with hands. Keep shoulders and neck down on the pillow and relaxed.   3 breaths     ____________      PELVIC FLOOR / KEGEL EXERCISES   Pelvic floor/ Kegel exercises are used to strengthen the muscles in the base of your pelvis that are responsible for supporting your pelvic organs and preventing urine/feces leakage. Based on your therapist's recommendations, they can be performed while standing, sitting, or lying down. Imagine pelvic floor area as a diamond with pelvic landmarks: top =pubic bone, bottom tip=tailbone, sides=sitting bones (ischial tuberosities).    Make yourself aware of this muscle group by using these cues while coordinating your breath:  Inhale, feel pelvic floor diamond area lower like hammock towards your feet and ribcage/belly expanding. Pause. Let the exhale naturally and feel your belly sink, abdominal muscles hugging in around you and you may notice the pelvic diamond draws upward towards your head forming a umbrella shape. Give a squeeze during the exhalation like you are stopping the flow of urine. If you are squeezing the buttock muscles, try to give 50% less effort.   Common Errors:  Breath holding: If you are holding your breath, you may be bearing down against your bladder instead of pulling it up. If you belly bulges up while you are squeezing, you are holding your breath. Be sure to breathe gently in and out while exercising. Counting out loud may  help you avoid holding your breath.  Accessory muscle use: You should not see or feel other muscle movement when performing pelvic floor exercises. When done properly, no one can tell that you are performing the exercises. Keep the buttocks, belly and inner thighs relaxed.  Overdoing it: Your muscles can fatigue and stop working for you if you over-exercise. You may actually leak more or feel soreness at the lower abdomen or rectum.  YOUR HOME EXERCISE PROGRAM  LONG HOLDS: Position: on back  Inhale and then exhale. Then squeeze the muscle and count aloud for 10 seconds. Rest with three long breaths. (Be sure to let belly sink in with exhales and not push outward)  Perform 3 repetitions, 5  times/day  SHORT HOLDS: Position: on back  Inhale and then exhale. Then squeeze the muscle.  (Be sure to let belly sink in with exhales and not push outward)  Perform 5 repetitions, 5  Times/day   _________                                                  Vincente PoliPreserve the function of your pelvic floor, abdomen, and back.              Avoid decreased straining of abdominal/pelvic floor muscles with less              slouching,  holding your breath  with lifting/bowel movements)                                                     FUNCTIONAL POSTURES                              DECREASE DOWNWARD PRESSURE ON  YOUR PELVIC FLOOR, ABDOMINAL, LOW BACK MUSCLES       PRESERVE YOUR PELVIC HEALTH LONG-TERM   ** SQUEEZE pelvic floor BEFORE YOUR SNEEZE, COUGH, LAUGH   ** EXHALE BEFORE YOU RISE AGAINST GRAVITY (lifting, sit to stand, from squat to stand)   ** LOG ROLL OUT OF BED INSTEAD OF CRUNCH/SIT-UP

## 2016-08-23 NOTE — Addendum Note (Signed)
Addended by: Mariane MastersYEUNG, SHIN-YIING on: 08/23/2016 05:07 PM   Modules accepted: Orders

## 2016-08-26 ENCOUNTER — Telehealth: Payer: Self-pay | Admitting: Physical Therapy

## 2016-08-26 NOTE — Telephone Encounter (Signed)
[facility] the RN of Dr. Bonney AidStaebler who referred pt to [facility] Pt. Message indicated that based on PT evaluation, pt would benefit from an order for lifting restriction/ limitation at work in order to minimize worsening of pelvic organ prolapse and to make progress with her pelvic floor strengthening program. PT indicated her evaluation was routed through Epic.  PT also spoke with pt over the phone today to inform her of this correspondence. Pt voiced understanding.

## 2017-01-04 DIAGNOSIS — F1911 Other psychoactive substance abuse, in remission: Secondary | ICD-10-CM | POA: Insufficient documentation

## 2017-12-24 ENCOUNTER — Telehealth: Payer: Self-pay

## 2017-12-24 NOTE — Telephone Encounter (Signed)
Pt called after hours triage stating she has a problem with a prolapse in her vaginal area. She would like for Dr.Staebler to call her.

## 2017-12-24 NOTE — Telephone Encounter (Signed)
Please schedule an appointment with any provider next week. If with AMS, it does not have to be a double book.

## 2017-12-27 ENCOUNTER — Ambulatory Visit: Payer: Self-pay | Admitting: Obstetrics and Gynecology

## 2017-12-30 ENCOUNTER — Ambulatory Visit (INDEPENDENT_AMBULATORY_CARE_PROVIDER_SITE_OTHER): Payer: Medicaid Other | Admitting: Obstetrics and Gynecology

## 2017-12-30 ENCOUNTER — Encounter: Payer: Self-pay | Admitting: Obstetrics and Gynecology

## 2017-12-30 VITALS — BP 120/70 | HR 87 | Ht 60.0 in | Wt 125.0 lb

## 2017-12-30 DIAGNOSIS — N939 Abnormal uterine and vaginal bleeding, unspecified: Secondary | ICD-10-CM | POA: Diagnosis not present

## 2017-12-30 NOTE — Progress Notes (Signed)
Obstetrics & Gynecology Office Visit   Chief Complaint:  Chief Complaint  Patient presents with  . Follow-up    Rectocyle    History of Present Illness: Patient is a 32 year old [id number] presenting for concern of vaginal prolapse.  She was previously evaluated for the same complaint approximately 2 years ago following delivery of her daughter.  Delivery was uncomplicated vaginal, no laceration, induction secondary to IUGR with birth weight of 4lbs 14oz.  She also had urinary complaints at that time and was sent for pelvic PT.  She continues to report sensation of vaginal bulge and being able to see something coming out of her vagina with straining.  In addition the patient has noted dysmenorrhea and some worsening in her overall menstrual flow.  She was previously placed on norethindrone for the presumptive treatment of endometriosis but has not undergone a diagnostic laparoscopy.  She discontinued the norethindrone, and despite a lack of contraception has not conceived in the past two years.  Does report passage of clots with her menses.      Review of Systems: Review of sytems negative unless otherwise noted in HPI  Past Medical History:  Past Medical History:  Diagnosis Date  . Bacterial vaginosis   . Bartholin's gland abscess   . Heart murmur   . Hepatitis C     Past Surgical History:  Past Surgical History:  Procedure Laterality Date  .  Barholins gland abscess drainage      Gynecologic History: Patient's last menstrual period was 12/03/2017.  Obstetric History: [id number]  Family History:  No family history on file.  Social History:  Social History   Socioeconomic History  . Marital status: Married    Spouse name: Not on file  . Number of children: Not on file  . Years of education: Not on file  . Highest education level: Not on file  Occupational History  . Not on file  Social Needs  . Financial resource strain: Not on file  . Food insecurity:    Worry:  Not on file    Inability: Not on file  . Transportation needs:    Medical: Not on file    Non-medical: Not on file  Tobacco Use  . Smoking status: Current Every Day Smoker    Packs/day: 1.00    Types: Cigarettes  . Smokeless tobacco: Never Used  Substance and Sexual Activity  . Alcohol use: No    Comment: Subutex current, Hx of Cannabis and Opioid   . Drug use: Yes    Types: Other-see comments  . Sexual activity: Yes    Birth control/protection: None  Lifestyle  . Physical activity:    Days per week: Not on file    Minutes per session: Not on file  . Stress: Not on file  Relationships  . Social connections:    Talks on phone: Not on file    Gets together: Not on file    Attends religious service: Not on file    Active member of club or organization: Not on file    Attends meetings of clubs or organizations: Not on file    Relationship status: Not on file  . Intimate partner violence:    Fear of current or ex partner: Not on file    Emotionally abused: Not on file    Physically abused: Not on file    Forced sexual activity: Not on file  Other Topics Concern  . Not on file  Social History  Narrative  . Not on file    Allergies:  Allergies  Allergen Reactions  . Motrin [Ibuprofen] Nausea And Vomiting  . Aspirin Swelling  . Neosporin [Neomycin-Bacitracin Zn-Polymyx] Swelling    Medications: Prior to Admission medications   Medication Sig Start Date End Date Taking? Authorizing Provider  buprenorphine (SUBUTEX) 8 MG SUBL SL tablet Place 4 mg under the tongue 2 (two) times daily.     [provider]  buprenorphine-naloxone (SUBOXONE) 2-0.5 MG SUBL SL tablet Place 1 tablet under the tongue daily.    [provider]    Physical Exam Vitals:  Vitals:   12/30/17 1429  BP: 120/70  Pulse: 87   Patient's last menstrual period was 12/03/2017.  General: NAD HEENT: normocephalic, anicteric Pulmonary: No increased work of  breathing Genitourinary:  External: Normal external female genitalia.  Normal urethral meatus, normal Bartholin's and Skene's glands.  Normal genital hiatus  Vagina: Normal vaginal mucosa, normal hymenal ring, no evidence of prolapse.  The exam is repeated only using one speculum blade and no prolapse is noted.  The vagina is able to accommodate two fingers, good muscle tone on Kegel.  Cervix: Grossly normal in appearance, no bleeding  Uterus: Non-enlarged, mobile, normal contour.  No CMT  Adnexa: ovaries non-enlarged, no adnexal masses  Rectal: deferred  Lymphatic: no evidence of inguinal lymphadenopathy Extremities: no edema, erythema, or tenderness Neurologic: Grossly intact Psychiatric: mood appropriate, affect full  Female chaperone present for pelvic portions of the physical exam  Assessment: 32 y.o. [id number] with concerns for vaginal laxity as well as abnormal uterine bleeding  Plan: Problem List Items Addressed This Visit    None    Visit Diagnoses    Abnormal uterine bleeding    -  Primary   Relevant Orders   TSH+Prl+FSH+TestT+LH+DHEA S...   US PELVIS TRANSVANGINAL NON-OB (TV ONLY)     1) Vaginal prolapse - from an exam stand point I am unable to demonstrate and lack of support in the anterior, posterior, or apical compartments.  I'm unable to offer the patient any surgical interventions because at this point there is no demonstrable prolapse for me to correct.  While we do not offer aesthetic procedure at our practice there are non surgical treatments which may address the patient's concern for vaginal laxity such as Geneveve and ThermiVa.  2) Abnormal uterine bleeding - her inability to conceive in the past years despite unprotected intercourse and abnormalities in bleeding warrant further work up.  Discussed management options for abnormal uterine bleeding including expectant, NSAIDs, tranexamic acid (Lysteda), oral progesterone (Provera, norethindrone, megace), Depo Provera,  Levonorgestrel containing IUD, endometrial ablation (Novasure) or hysterectomy as definitive surgical management.  Discussed risks and benefits of each method.   Final management decision will hinge on results of patient's work up and whether an underlying etiology for the patients bleeding symptoms can be discerned.  We will conduct a basic work up examining using the PALM-COIEN classification system. - records also indicated patient is due for pap smear will obtain at time of follow up ultrasound   3) Return in about 1 week (around 01/06/2018) for F/U US.    Vena AustriaAndreas Yarianna Varble, MD, Merlinda FrederickFACOG [facility], [facility] 12/31/2017, 12:54 PM

## 2018-01-04 LAB — TSH+PRL+FSH+TESTT+LH+DHEA S...
17-Hydroxyprogesterone: 359 ng/dL
Androstenedione: 148 ng/dL (ref 41–262)
DHEA-SO4: 191.8 ug/dL (ref 84.8–378.0)
FSH: 0.3 m[IU]/mL
PROLACTIN: 18.1 ng/mL (ref 4.8–23.3)
TESTOSTERONE FREE: 0.9 pg/mL (ref 0.0–4.2)
TESTOSTERONE: 20 ng/dL (ref 8–48)
TSH: 3.34 u[IU]/mL (ref 0.450–4.500)

## 2018-01-05 ENCOUNTER — Telehealth: Payer: Self-pay | Admitting: Obstetrics and Gynecology

## 2018-01-05 ENCOUNTER — Encounter: Payer: Self-pay | Admitting: Obstetrics and Gynecology

## 2018-01-05 NOTE — Telephone Encounter (Signed)
Please advise and send to Huntley DecSara to schedule. Thanks

## 2018-01-05 NOTE — Telephone Encounter (Signed)
If we don't have anything prior to 3 week for NOB she can keep that appointment and if confirmed on ultrasound I'll run it as a NOB

## 2018-01-05 NOTE — Telephone Encounter (Signed)
Patient schedule for ultrasound and follow up on 01/17/18 with AMS> Patient reports she had an positive pregnancy test and she doesn't know if she sure keep appointment schedule or if she a change to the type of appointment ., NOB are schedule over 3 weeks out. Please advise

## 2018-01-14 ENCOUNTER — Ambulatory Visit (INDEPENDENT_AMBULATORY_CARE_PROVIDER_SITE_OTHER): Payer: Medicaid Other | Admitting: Advanced Practice Midwife

## 2018-01-14 ENCOUNTER — Encounter: Payer: Self-pay | Admitting: Advanced Practice Midwife

## 2018-01-14 VITALS — BP 120/80 | Wt 124.0 lb

## 2018-01-14 DIAGNOSIS — O0990 Supervision of high risk pregnancy, unspecified, unspecified trimester: Secondary | ICD-10-CM

## 2018-01-14 DIAGNOSIS — B182 Chronic viral hepatitis C: Secondary | ICD-10-CM

## 2018-01-14 DIAGNOSIS — Z8619 Personal history of other infectious and parasitic diseases: Secondary | ICD-10-CM | POA: Insufficient documentation

## 2018-01-14 DIAGNOSIS — Z113 Encounter for screening for infections with a predominantly sexual mode of transmission: Secondary | ICD-10-CM

## 2018-01-14 NOTE — Progress Notes (Signed)
New Obstetric Patient H&P    Chief Complaint: "Desires prenatal care"   History of Present Illness: Patient is a 32 y.o. [id number] Not Hispanic or Latino female, presents with amenorrhea and positive home pregnancy test. Patient's last menstrual period was 12/07/2017 (approximate). and based on her  LMP, her EDD is Estimated Date of Delivery: 09/13/18 and her EGA is [id number]. Cycles are 3. days, regular, and occur approximately every : 14 days. Her last pap smear was 1 years ago and was no abnormalities.    She had a urine pregnancy test which was positive 1 week(s)  ago. Her last menstrual period was normal and lasted for  3 day(s). Since her LMP she claims she has experienced breast tenderness, fatigue. She denies vaginal bleeding. Her past medical history is contributory for Hepatitis C infection with treatment and history of drug abuse currently on Suboxone. She plans to switch to Subutex. Her prior pregnancies are notable for 39 week induction for aging placenta.  Since her LMP, she admits to the use of tobacco products  Yes she smokes 5 cig/day She claims she has gained   1 pounds since the start of her pregnancy.  There are cats in the home in the home  no  She admits close contact with children on a regular basis  yes  She has had chicken pox in the past yes She has had Tuberculosis exposures, symptoms, or previously tested positive for TB   no Current or past history of domestic violence. no  Genetic Screening/Teratology Counseling: (Includes patient, baby's father, or anyone in either family with:)   1. [facility]/= 7135 [facility]  no 2. Thalassemia ([place], [place], Mediterranean, or Asian background): MCV<80  no 3. Neural tube defect (meningomyelocele, spina bifida, anencephaly)  no 4. Congenital heart defect  no  5. Down syndrome  no 6. Tay-Sachs (Jewish, [place])  no 7. Canavan's Disease  no 8. Sickle cell disease or trait (African)  no  9. Hemophilia or other blood  disorders  no  10. Muscular dystrophy  no  11. Cystic fibrosis  no  12. Huntington's Chorea  no  13. Mental retardation/autism  no 14. Other inherited genetic or chromosomal disorder  no 15. Maternal metabolic disorder (DM, PKU, etc)  no 16. Patient or FOB with a child with a birth defect not listed above no  16a. Patient or FOB with a birth defect themselves no 17. Recurrent pregnancy loss, or stillbirth  no  18. Any medications since LMP other than prenatal vitamins (include vitamins, supplements, OTC meds, drugs, alcohol)  no 19. Any other genetic/environmental exposure to discuss  no  Infection History:   1. Lives with someone with TB or TB exposed  no  2. Patient or partner has history of genital herpes  no 3. Rash or viral illness since LMP  no 4. History of STI (GC, CT, HPV, syphilis, HIV)  no 5. History of recent travel :  no  Other pertinent information:  [facility]    Review of Systems:10 point review of systems negative unless otherwise noted in HPI  Past Medical History:  Past Medical History:  Diagnosis Date  . Bacterial vaginosis   . Bartholin's gland abscess   . Heart murmur   . Hepatitis C     Past Surgical History:  Past Surgical History:  Procedure Laterality Date  .  Barholins gland abscess drainage      Gynecologic History: Patient's  last menstrual period was 12/07/2017 (approximate).  Obstetric History: [id number]  Family History:  Family History  Problem Relation Age of Onset  . Ovarian cancer Mother   . Hypertension Maternal Grandmother   . Hypertension Maternal Grandfather   . Heart disease Maternal Grandfather   . Lung cancer Paternal Grandmother     Social History:  Social History   Socioeconomic History  . Marital status: Married    Spouse name: Not on file  . Number of children: Not on file  . Years of education: Not on file  . Highest education level: Not on file  Occupational History  .  Not on file  Social Needs  . Financial resource strain: Not on file  . Food insecurity:    Worry: Not on file    Inability: Not on file  . Transportation needs:    Medical: Not on file    Non-medical: Not on file  Tobacco Use  . Smoking status: Current Every Day Smoker    Packs/day: 1.00    Types: Cigarettes  . Smokeless tobacco: Never Used  Substance and Sexual Activity  . Alcohol use: No    Comment: Subutex current, Hx of Cannabis and Opioid   . Drug use: Yes    Types: Other-see comments  . Sexual activity: Yes    Birth control/protection: None  Lifestyle  . Physical activity:    Days per week: Not on file    Minutes per session: Not on file  . Stress: Not on file  Relationships  . Social connections:    Talks on phone: Not on file    Gets together: Not on file    Attends religious service: Not on file    Active member of club or organization: Not on file    Attends meetings of clubs or organizations: Not on file    Relationship status: Not on file  . Intimate partner violence:    Fear of current or ex partner: Not on file    Emotionally abused: Not on file    Physically abused: Not on file    Forced sexual activity: Not on file  Other Topics Concern  . Not on file  Social History Narrative  . Not on file    Allergies:  Allergies  Allergen Reactions  . Motrin [Ibuprofen] Nausea And Vomiting  . Aspirin Swelling  . Benzalkonium Chloride Swelling  . Neosporin [Neomycin-Bacitracin Zn-Polymyx] Swelling    Medications: Prior to Admission medications   Medication Sig Start Date End Date Taking? Authorizing Provider  buprenorphine-naloxone (SUBOXONE) 2-0.5 MG SUBL SL tablet Place 1 tablet under the tongue daily.   Yes [provider]  buprenorphine (SUBUTEX) 8 MG SUBL SL tablet Place 4 mg under the tongue 2 (two) times daily.     [provider]    Physical Exam Vitals: Blood pressure 120/80, weight 124 lb (56.2 kg), last menstrual period  12/07/2017  General: NAD HEENT: normocephalic, anicteric Thyroid: no enlargement, no palpable nodules Pulmonary: No increased work of breathing, CTAB Cardiovascular: RRR, distal pulses 2+ Abdomen: NABS, soft, non-tender, non-distended.  Umbilicus without lesions.  No hepatomegaly, splenomegaly or masses palpable. No evidence of hernia  Genitourinary:  Deferred for recent normal PAP smear. Aptima done with urine Extremities: no edema, erythema, or tenderness Neurologic: Grossly intact Psychiatric: mood appropriate, affect full   Assessment: 32 y.o. [id number] at [redacted]w[redacted]d presenting to initiate prenatal care  Plan: 1) Avoid alcoholic beverages. 2) Patient encouraged not to smoke.  3) Discontinue the use  of all non-medicinal drugs and chemicals.  4) Take prenatal vitamins daily.  5) Nutrition, food safety (fish, cheese advisories, and high nitrite foods) and exercise discussed. 6) Hospital and practice style discussed with cross coverage system.  7) Genetic Screening, such as with 1st Trimester Screening, cell free fetal DNA, AFP testing, and Ultrasound, as well as with amniocentesis and CVS as appropriate, is discussed with patient. At the conclusion of today's visit patient requested cfDNA genetic testing 8) Patient is asked about travel to areas at risk for the Zika virus, and counseled to avoid travel and exposure to mosquitoes or sexual partners who may have themselves been exposed to the virus. Testing is discussed, and will be ordered as appropriate.  9) Follow up visit is scheduled for Monday- dating ultrasound and rob   Tresea Mall, CNM [facility], [facility] 01/14/2018, 1:08 PM

## 2018-01-14 NOTE — Patient Instructions (Signed)
Prenatal Care WHAT IS PRENATAL CARE? Prenatal care is the process of caring for a pregnant woman before she gives birth. Prenatal care makes sure that she and her baby remain as healthy as possible throughout pregnancy. Prenatal care may be provided by a midwife, family practice health care provider, or a childbirth and pregnancy specialist (obstetrician). Prenatal care may include physical examinations, testing, treatments, and education on nutrition, lifestyle, and social support services. WHY IS PRENATAL CARE SO IMPORTANT? Early and consistent prenatal care increases the chance that you and your baby will remain healthy throughout your pregnancy. This type of care also decreases a baby's risk of being born too early (prematurely), or being born smaller than expected (small for gestational age). Any underlying medical conditions you may have that could pose a risk during your pregnancy are discussed during prenatal care visits. You will also be monitored regularly for any new conditions that may arise during your pregnancy so they can be treated quickly and effectively. WHAT HAPPENS DURING PRENATAL CARE VISITS? Prenatal care visits may include the following: Discussion Tell your health care provider about any new signs or symptoms you have experienced since your last visit. These might include:  Nausea or vomiting.  Increased or decreased level of energy.  Difficulty sleeping.  Back or leg pain.  Weight changes.  Frequent urination.  Shortness of breath with physical activity.  Changes in your skin, such as the development of a rash or itchiness.  Vaginal discharge or bleeding.  Feelings of excitement or nervousness.  Changes in your baby's movements.  You may want to write down any questions or topics you want to discuss with your health care provider and bring them with you to your appointment. Examination During your first prenatal care visit, you will likely have a complete  physical exam. Your health care provider will often examine your vagina, cervix, and the position of your uterus, as well as check your heart, lungs, and other body systems. As your pregnancy progresses, your health care provider will measure the size of your uterus and your baby's position inside your uterus. He or she may also examine you for early signs of labor. Your prenatal visits may also include checking your blood pressure and, after about 10-12 weeks of pregnancy, listening to your baby's heartbeat. Testing Regular testing often includes:  Urinalysis. This checks your urine for glucose, protein, or signs of infection.  Blood count. This checks the levels of white and red blood cells in your body.  Tests for sexually transmitted infections (STIs). Testing for STIs at the beginning of pregnancy is routinely done and is required in many states.  Antibody testing. You will be checked to see if you are immune to certain illnesses, such as rubella, that can affect a developing fetus.  Glucose screen. Around 24-28 weeks of pregnancy, your blood glucose level will be checked for signs of gestational diabetes. Follow-up tests may be recommended.  Group B strep. This is a bacteria that is commonly found inside a woman's vagina. This test will inform your health care provider if you need an antibiotic to reduce the amount of this bacteria in your body prior to labor and childbirth.  Ultrasound. Many pregnant women undergo an ultrasound screening around 18-20 weeks of pregnancy to evaluate the health of the fetus and check for any developmental abnormalities.  HIV (human immunodeficiency virus) testing. Early in your pregnancy, you will be screened for HIV. If you are at high risk for HIV, this test may   be repeated during your third trimester of pregnancy.  You may be offered other testing based on your age, personal or family medical history, or other factors. HOW OFTEN SHOULD I PLAN TO SEE MY  HEALTH CARE PROVIDER FOR PRENATAL CARE? Your prenatal care check-up schedule depends on any medical conditions you have before, or develop during, your pregnancy. If you do not have any underlying medical conditions, you will likely be seen for checkups:  Monthly, during the first 6 months of pregnancy.  Twice a month during months 7 and 8 of pregnancy.  Weekly starting in the 9th month of pregnancy and until delivery.  If you develop signs of early labor or other concerning signs or symptoms, you may need to see your health care provider more often. Ask your health care provider what prenatal care schedule is best for you. WHAT CAN I DO TO KEEP MYSELF AND MY BABY AS HEALTHY AS POSSIBLE DURING MY PREGNANCY?  Take a prenatal vitamin containing 400 micrograms (0.4 mg) of folic acid every day. Your health care provider may also ask you to take additional vitamins such as iodine, vitamin D, iron, copper, and zinc.  Take 1500-2000 mg of calcium daily starting at your 20th week of pregnancy until you deliver your baby.  Make sure you are up to date on your vaccinations. Unless directed otherwise by your health care provider: ? You should receive a tetanus, diphtheria, and pertussis (Tdap) vaccination between the 27th and 36th week of your pregnancy, regardless of when your last Tdap immunization occurred. This helps protect your baby from whooping cough (pertussis) after he or she is born. ? You should receive an annual inactivated influenza vaccine (IIV) to help protect you and your baby from influenza. This can be done at any point during your pregnancy.  Eat a well-rounded diet that includes: ? Fresh fruits and vegetables. ? Lean proteins. ? Calcium-rich foods such as milk, yogurt, hard cheeses, and dark, leafy greens. ? Whole grain breads.  Do noteat seafood high in mercury, including: ? Swordfish. ? Tilefish. ? Shark. ? King mackerel. ? More than 6 oz tuna per week.  Do not  eat: ? Raw or undercooked meats or eggs. ? Unpasteurized foods, such as soft cheeses (brie, blue, or feta), juices, and milks. ? Lunch meats. ? Hot dogs that have not been heated until they are steaming.  Drink enough water to keep your urine clear or pale yellow. For many women, this may be 10 or more 8 oz glasses of water each day. Keeping yourself hydrated helps deliver nutrients to your baby and may prevent the start of pre-term uterine contractions.  Do not use any tobacco products including cigarettes, chewing tobacco, or electronic cigarettes. If you need help quitting, ask your health care provider.  Do not drink beverages containing alcohol. No safe level of alcohol consumption during pregnancy has been determined.  Do not use any illegal drugs. These can harm your developing baby or cause a miscarriage.  Ask your health care provider or pharmacist before taking any prescription or over-the-counter medicines, herbs, or supplements.  Limit your caffeine intake to no more than 200 mg per day.  Exercise. Unless told otherwise by your health care provider, try to get 30 minutes of moderate exercise most days of the week. Do not  do high-impact activities, contact sports, or activities with a high risk of falling, such as horseback riding or downhill skiing.  Get plenty of rest.  Avoid anything that raises your   body temperature, such as hot tubs and saunas.  If you own a cat, do not empty its litter box. Bacteria contained in cat feces can cause an infection called toxoplasmosis. This can result in serious harm to the fetus.  Stay away from chemicals such as insecticides, lead, mercury, and cleaning or paint products that contain solvents.  Do not have any X-rays taken unless medically necessary.  Take a childbirth and breastfeeding preparation class. Ask your health care provider if you need a referral or recommendation.  This information is not intended to replace advice given  to you by your health care provider. Make sure you discuss any questions you have with your health care provider. Document Released: 09/10/2003 Document Revised: 02/10/2016 Document Reviewed: 11/22/2013 Elsevier Interactive Patient Education  2017 [employer] Exercise During Pregnancy For people of all ages, exercise is an important part of being healthy. Exercise improves heart and lung function and helps to maintain strength, flexibility, and a healthy body weight. Exercise also boosts energy levels and elevates mood. For most women, maintaining an exercise routine throughout pregnancy is recommended. It is only on rare occasions and with certain medical conditions or pregnancy complications that women may be asked to limit or avoid exercise during pregnancy. What are some other benefits to exercising during pregnancy? Along with maintaining strength and flexibility, exercising throughout pregnancy can help to:  Keep strength in muscles that are very important during labor and childbirth.  Decrease low back pain during pregnancy.  Decrease the risk of developing gestational diabetes mellitus (GDM).  Improve blood sugar (glucose) control for women who have GDM.  Decrease the risk of developing preeclampsia. This is a serious condition that causes high blood pressure along with other symptoms, such as swelling and headaches.  Decrease the risk of cesarean delivery.  Speed up the recovery after giving birth.  How often should I exercise? Unless your health care provider gives you different instructions, you should try to exercise on most days or all days of the week. In general, try to exercise with moderate intensity for about 150 minutes per week. This can be spread out across several days, such as exercising for 30 minutes per day on 5 days of each week. You can tell that you are exercising at a moderate intensity if you have a higher heart rate and faster breathing, but you are still  able to hold a conversation. What types of moderate-intensity exercise are recommended during pregnancy? There are many types of exercise that are safe for you to do during pregnancy. Unless your health care provider gives you different instructions, do a variety of exercises that safely increase your heart and breathing (cardiopulmonary) rates and help you to build and maintain muscle strength (strength training). You should always be able to talk in full sentences while exercising during pregnancy. Some examples of exercising that is safe to do during pregnancy include:  Brisk walking or hiking.  Swimming.  Water aerobics.  Riding a stationary bike.  Strength training.  Modified yoga or Pilates. Tell your instructor that you are pregnant. Avoid overstretching and avoid lying on your back for long periods of time.  Running or jogging. Only choose this type of exercise if: ? You ran or jogged regularly before your pregnancy. ? You can run or jog and still talk in complete sentences.  What types of exercise should I not do during pregnancy? Depending on your level of fitness and whether you exercised regularly before your pregnancy, you may be   advised to limit vigorous-intensity exercise during your pregnancy. You can tell that you are exercising at a vigorous intensity if you are breathing much harder and faster and cannot hold a conversation while exercising. Some examples of exercising that you should avoid during pregnancy include:  Contact sports.  Activities that place you at risk for falling on or being hit in the belly, such as downhill skiing, water skiing, surfing, rock climbing, cycling, gymnastics, and horseback riding.  Scuba diving.  Sky diving.  Yoga or Pilates in a room that is heated to extreme temperatures ("hot yoga" or "hot Pilates").  Jogging or running, unless you ran or jogged regularly before your pregnancy. While jogging or running, you should always be able  to talk in full sentences. Do not run or jog so vigorously that you are unable to have a conversation.  If you are not used to exercising at elevation (more than 6,000 feet above sea level), do not do so during your pregnancy.  When should I avoid exercising during pregnancy? Certain medical conditions can make it unsafe to exercise during pregnancy, or they may increase your risk of miscarriage or early labor and birth. Some of these conditions include:  Some types of heart disease.  Some types of lung disease.  Placenta previa. This is when the placenta partially or completely covers the opening of the uterus (cervix).  Frequent bleeding from the vagina during your pregnancy.  Incompetent cervix. This is when your cervix does not remain as tightly closed during pregnancy as it should.  Premature labor.  Ruptured membranes. This is when the protective sac (amniotic sac) opens up and amniotic fluid leaks from your vagina.  Severely low blood count (anemia).  Preeclampsia or pregnancy-caused high blood pressure.  Carrying more than one baby (multiple gestation) and having an additional risk of early labor.  Poorly controlled diabetes.  Being severely underweight or severely overweight.  Intrauterine growth restriction. This is when your baby's growth and development during pregnancy are slower than expected.  Other medical conditions. Ask your health care provider if any apply to you.  What else should I know about exercising during pregnancy? You should take these precautions while exercising during pregnancy:  Avoid overheating. ? Wear loose-fitting, breathable clothes. ? Do not exercise in very high temperatures.  Avoid dehydration. Drink enough water before, during, and after exercise to keep your urine clear or pale yellow.  Avoid overstretching. Because of hormone changes during pregnancy, it is easy to overstretch muscles, tendons, and ligaments during  pregnancy.  Start slowly and ask your health care provider to recommend types of exercise that are safe for you, if exercising regularly is new for you.  Pregnancy is not a time for exercising to lose weight. When should I seek medical care? You should stop exercising and call your health care provider if you have any unusual symptoms, such as:  Mild uterine contractions or abdominal cramping.  Dizziness that does not improve with rest.  When should I seek immediate medical care? You should stop exercising and call your local emergency services (911 in the [place] if you have any unusual symptoms, such as:  Sudden, severe pain in your low back or your belly.  Uterine contractions or abdominal cramping that do not improve with rest.  Chest pain.  Bleeding or fluid leaking from your vagina.  Shortness of breath.  This information is not intended to replace advice given to you by your health care provider. Make sure you discuss any   questions you have with your health care provider. Document Released: 09/07/2005 Document Revised: 02/05/2016 Document Reviewed: 11/15/2014 [employer] Interactive Patient Education  2018 [employer] Eating Plan for Pregnant Women While you are pregnant, your body will require additional nutrition to help support your growing baby. It is recommended that you consume:  150 additional calories each day during your first trimester.  300 additional calories each day during your second trimester.  300 additional calories each day during your third trimester.  Eating a healthy, well-balanced diet is very important for your health and for your baby's health. You also have a higher need for some vitamins and minerals, such as folic acid, calcium, iron, and vitamin D. What do I need to know about eating during pregnancy?  Do not try to lose weight or go on a diet during pregnancy.  Choose healthy, nutritious foods. Choose  of a sandwich with a glass of milk  instead of a candy bar or a high-calorie sugar-sweetened beverage.  Limit your overall intake of foods that have "empty calories." These are foods that have little nutritional value, such as sweets, desserts, candies, sugar-sweetened beverages, and fried foods.  Eat a variety of foods, especially fruits and vegetables.  Take a prenatal vitamin to help meet the additional needs during pregnancy, specifically for folic acid, iron, calcium, and vitamin D.  Remember to stay active. Ask your health care provider for exercise recommendations that are specific to you.  Practice good food safety and cleanliness, such as washing your hands before you eat and after you prepare raw meat. This helps to prevent foodborne illnesses, such as listeriosis, that can be very dangerous for your baby. Ask your health care provider for more information about listeriosis. What does 150 extra calories look like? Healthy options for an additional 150 calories each day could be any of the following:  Plain low-fat yogurt (6-8 oz) with  cup of berries.  1 apple with 2 teaspoons of peanut butter.  Cut-up vegetables with  cup of hummus.  Low-fat chocolate milk (8 oz or 1 cup).  1 string cheese with 1 medium orange.   of a peanut butter and jelly sandwich on whole-wheat bread (1 tsp of peanut butter).  For 300 calories, you could eat two of those healthy options each day. What is a healthy amount of weight to gain? The recommended amount of weight for you to gain is based on your pre-pregnancy BMI. If your pre-pregnancy BMI was:  Less than 18 (underweight), you should gain 28-40 lb.  18-24.9 (normal), you should gain 25-35 lb.  25-29.9 (overweight), you should gain 15-25 lb.  Greater than 30 (obese), you should gain 11-20 lb.  What if I am having twins or multiples? Generally, pregnant women who will be having twins or multiples may need to increase their daily calories by 300-600 calories each day. The  recommended range for total weight gain is 25-54 lb, depending on your pre-pregnancy BMI. Talk with your health care provider for specific guidance about additional nutritional needs, weight gain, and exercise during your pregnancy. What foods can I eat? Grains Any grains. Try to choose whole grains, such as whole-wheat bread, oatmeal, or brown rice. Vegetables Any vegetables. Try to eat a variety of colors and types of vegetables to get a full range of vitamins and minerals. Remember to wash your vegetables well before eating. Fruits Any fruits. Try to eat a variety of colors and types of fruit to get a full range of vitamins and   minerals. Remember to wash your fruits well before eating. Meats and Other Protein Sources Lean meats, including chicken, turkey, fish, and lean cuts of beef, veal, or pork. Make sure that all meats are cooked to "well done." Tofu. Tempeh. Beans. Eggs. Peanut butter and other nut butters. Seafood, such as shrimp, crab, and lobster. If you choose fish, select types that are higher in omega-3 fatty acids, including salmon, herring, mussels, trout, sardines, and pollock. Make sure that all meats are cooked to food-safe temperatures. Dairy Pasteurized milk and milk alternatives. Pasteurized yogurt and pasteurized cheese. Cottage cheese. Sour cream. Beverages Water. Juices that contain 100% fruit juice or vegetable juice. Caffeine-free teas and decaffeinated coffee. Drinks that contain caffeine are okay to drink, but it is better to avoid caffeine. Keep your total caffeine intake to less than 200 mg each day (12 oz of coffee, tea, or soda) or as directed by your health care provider. Condiments Any pasteurized condiments. Sweets and Desserts Any sweets and desserts. Fats and Oils Any fats and oils. The items listed above may not be a complete list of recommended foods or beverages. Contact your dietitian for more options. What foods are not  recommended? Vegetables Unpasteurized (raw) vegetable juices. Fruits Unpasteurized (raw) fruit juices. Meats and Other Protein Sources Cured meats that have nitrates, such as bacon, salami, and hotdogs. Luncheon meats, bologna, or other deli meats (unless they are reheated until they are steaming hot). Refrigerated pate, meat spreads from a meat counter, smoked seafood that is found in the refrigerated section of a store. Raw fish, such as sushi or sashimi. High mercury content fish, such as tilefish, shark, swordfish, and king mackerel. Raw meats, such as tuna or beef tartare. Undercooked meats and poultry. Make sure that all meats are cooked to food-safe temperatures. Dairy Unpasteurized (raw) milk and any foods that have raw milk in them. Soft cheeses, such as feta, queso blanco, queso fresco, Brie, Camembert cheeses, blue-veined cheeses, and Panela cheese (unless it is made with pasteurized milk, which must be stated on the label). Beverages Alcohol. Sugar-sweetened beverages, such as sodas, teas, or energy drinks. Condiments Homemade fermented foods and drinks, such as pickles, sauerkraut, or kombucha drinks. (Store-bought pasteurized versions of these are okay.) Other Salads that are made in the store, such as ham salad, chicken salad, egg salad, tuna salad, and seafood salad. The items listed above may not be a complete list of foods and beverages to avoid. Contact your dietitian for more information. This information is not intended to replace advice given to you by your health care provider. Make sure you discuss any questions you have with your health care provider. Document Released: 06/22/2014 Document Revised: 02/13/2016 Document Reviewed: 02/20/2014 [employer] Interactive Patient Education  2018 [employer]  

## 2018-01-17 ENCOUNTER — Ambulatory Visit (INDEPENDENT_AMBULATORY_CARE_PROVIDER_SITE_OTHER): Payer: Medicaid Other

## 2018-01-17 ENCOUNTER — Ambulatory Visit (INDEPENDENT_AMBULATORY_CARE_PROVIDER_SITE_OTHER): Payer: Medicaid Other | Admitting: Obstetrics and Gynecology

## 2018-01-17 ENCOUNTER — Other Ambulatory Visit: Payer: Self-pay | Admitting: Obstetrics and Gynecology

## 2018-01-17 VITALS — BP 110/60 | Wt 124.0 lb

## 2018-01-17 DIAGNOSIS — O0990 Supervision of high risk pregnancy, unspecified, unspecified trimester: Secondary | ICD-10-CM | POA: Diagnosis not present

## 2018-01-17 DIAGNOSIS — Z3401 Encounter for supervision of normal first pregnancy, first trimester: Secondary | ICD-10-CM | POA: Diagnosis not present

## 2018-01-17 DIAGNOSIS — B182 Chronic viral hepatitis C: Secondary | ICD-10-CM

## 2018-01-17 DIAGNOSIS — Z3480 Encounter for supervision of other normal pregnancy, unspecified trimester: Secondary | ICD-10-CM | POA: Insufficient documentation

## 2018-01-17 DIAGNOSIS — Z3A01 Less than 8 weeks gestation of pregnancy: Secondary | ICD-10-CM

## 2018-01-17 DIAGNOSIS — N939 Abnormal uterine and vaginal bleeding, unspecified: Secondary | ICD-10-CM

## 2018-01-17 NOTE — Progress Notes (Signed)
ROB U/S today 

## 2018-01-18 LAB — URINE CULTURE: ORGANISM ID, BACTERIA: NO GROWTH

## 2018-01-18 LAB — URINE DRUG PANEL 7
AMPHETAMINES, URINE: NEGATIVE ng/mL
Barbiturate Quant, Ur: NEGATIVE ng/mL
Benzodiazepine Quant, Ur: NEGATIVE ng/mL
Cannabinoid Quant, Ur: NEGATIVE ng/mL
Cocaine (Metab.): NEGATIVE ng/mL
Opiate Quant, Ur: NEGATIVE ng/mL
PCP Quant, Ur: NEGATIVE ng/mL

## 2018-01-18 LAB — CHLAMYDIA/GONOCOCCUS/TRICHOMONAS, NAA
CHLAMYDIA BY NAA: NEGATIVE
Gonococcus by NAA: NEGATIVE
TRICH VAG BY NAA: NEGATIVE

## 2018-01-18 NOTE — Progress Notes (Signed)
Routine Prenatal Care Visit  Subjective  Laurie Oliver is a 32 y.o. [phone] at [redacted]w[redacted]d being seen today for ongoing prenatal care.  She is currently monitored for the following issues for this high-risk pregnancy and has History of mixed drug abuse; Hepatitis C, chronic (HCC); and Supervision of high risk pregnancy, antepartum on their problem list.  ----------------------------------------------------------------------------------- Patient reports no bleeding.   Contractions: Not present. Vag. Bleeding: None.  Movement: Absent. Denies leaking of fluid.  ----------------------------------------------------------------------------------- The following portions of the patient's history were reviewed and updated as appropriate: allergies, current medications, past family history, past medical history, past social history, past surgical history and problem list. Problem list updated.   Objective  Blood pressure 110/60, weight 124 lb (56.2 kg), last menstrual period 12/07/2017, unknown if currently breastfeeding. Pregravid weight 123 lb (55.8 kg) Total Weight Gain 1 lb (0.454 kg) Urinalysis: Urine Protein: Negative Urine Glucose: Negative  Fetal Status: Fetal Heart Rate (bpm): 150   Movement: Absent     General:  Alert, oriented and cooperative. Patient is in no acute distress.  Skin: Skin is warm and dry. No rash noted.   Cardiovascular: Normal heart rate noted  Respiratory: Normal respiratory effort, no problems with respiration noted  Abdomen: Soft, gravid, appropriate for gestational age. Pain/Pressure: Absent     Pelvic:  Cervical exam deferred        Extremities: Normal range of motion.     ental Status: Normal mood and affect. Normal behavior. Normal judgment and thought content.   US Ob Less Than 14 Weeks With Ob Transvaginal  Result Date: 01/17/2018 ULTRASOUND REPORT Patient Name: Laurie Oliver DOB: 1985/12/30 MRN: [id number] Location: [facility] Date of Service: 01/17/2018  Indications:dating Findings: Mason Jim intrauterine pregnancy is visualized with a CRL consistent with [redacted]w[redacted]d gestation, giving an (U/S) EDD of 09/11/2018. The (U/S) EDD is consistent with the clinically established EDD of 09/13/2018. FHR: 99 bpm CRL measurement: 4.6 mm Yolk sac is visualized and appears normal and early anatomy is normal. Amnion: not visualized Right Ovary is not normal in appearance. - ovarian cyst measuring 2.78 x 3.16 Left Ovary is normal appearance. Corpus luteal cyst:  Right ovary Survey of the adnexa demonstrates no adnexal masses. There is no free peritoneal fluid in the cul de sac. Impression: 1. [redacted]w[redacted]d viable IUP Recommendations: 1.Clinical correlation with the patient's History and Physical Exam. Willette Alma There is a viable singleton gestation.  The fetal biometry correlates with established dating, however given only approximate LMP will [place] ultrasound EDD as most reliable EDD. Detailed evaluation of the fetal anatomy is precluded by early gestational age.  It must be noted that a normal ultrasound particular at this early gestational age is unable to rule out fetal aneuploidy, risk of first trimester miscarriage, or anatomic birth defects. Vena Austria, MD, [facility], [facility] 01/17/2018, 5:21 PM    Assessment   31 y.o. [id number] at [redacted]w[redacted]d by  09/11/2018, by Ultrasound presenting for routine prenatal visit  Plan   pregnancy 2 Problems (from 12/07/17 to present)    Problem Noted Resolved   History of mixed drug abuse 04/07/2015 by Conard Novak, MD No       Gestational age appropriate obstetric precautions including but not limited to vaginal bleeding, contractions, leaking of fluid and fetal movement were reviewed in detail with the patient.    Return in about 1 month (around 02/14/2018) for ROB.  Vena Austria, MD, [facility], [facility]  01/18/2018, 8:15 AM

## 2018-01-23 ENCOUNTER — Other Ambulatory Visit: Payer: Self-pay

## 2018-01-23 DIAGNOSIS — O99321 Drug use complicating pregnancy, first trimester: Secondary | ICD-10-CM | POA: Insufficient documentation

## 2018-01-23 DIAGNOSIS — O368311 Maternal care for abnormalities of the fetal heart rate or rhythm, first trimester, fetus 1: Secondary | ICD-10-CM | POA: Diagnosis not present

## 2018-01-23 DIAGNOSIS — O208 Other hemorrhage in early pregnancy: Secondary | ICD-10-CM | POA: Insufficient documentation

## 2018-01-23 DIAGNOSIS — Z79899 Other long term (current) drug therapy: Secondary | ICD-10-CM | POA: Diagnosis not present

## 2018-01-23 DIAGNOSIS — Y998 Other external cause status: Secondary | ICD-10-CM | POA: Insufficient documentation

## 2018-01-23 DIAGNOSIS — Z3A01 Less than 8 weeks gestation of pregnancy: Secondary | ICD-10-CM | POA: Insufficient documentation

## 2018-01-23 DIAGNOSIS — F17210 Nicotine dependence, cigarettes, uncomplicated: Secondary | ICD-10-CM | POA: Diagnosis not present

## 2018-01-23 DIAGNOSIS — O99331 Smoking (tobacco) complicating pregnancy, first trimester: Secondary | ICD-10-CM | POA: Insufficient documentation

## 2018-01-23 DIAGNOSIS — F1590 Other stimulant use, unspecified, uncomplicated: Secondary | ICD-10-CM | POA: Insufficient documentation

## 2018-01-23 DIAGNOSIS — O9989 Other specified diseases and conditions complicating pregnancy, childbirth and the puerperium: Secondary | ICD-10-CM | POA: Diagnosis present

## 2018-01-23 DIAGNOSIS — Y9389 Activity, other specified: Secondary | ICD-10-CM | POA: Insufficient documentation

## 2018-01-23 LAB — URINALYSIS, ROUTINE W REFLEX MICROSCOPIC
Bilirubin Urine: NEGATIVE
GLUCOSE, UA: NEGATIVE mg/dL
Hgb urine dipstick: NEGATIVE
Ketones, ur: NEGATIVE mg/dL
LEUKOCYTES UA: NEGATIVE
Nitrite: NEGATIVE
PROTEIN: NEGATIVE mg/dL
Specific Gravity, Urine: 1.003 — ABNORMAL LOW (ref 1.005–1.030)
pH: 7 (ref 5.0–8.0)

## 2018-01-23 LAB — POCT PREGNANCY, URINE: Preg Test, Ur: POSITIVE — AB
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
## 2018-01-24 ENCOUNTER — Emergency Department: Payer: Medicaid Other

## 2018-01-24 ENCOUNTER — Emergency Department
Admission: EM | Admit: 2018-01-24 | Discharge: 2018-01-24 | Disposition: A | Discharge status: Home / Self Care | Payer: Medicaid Other | Attending: Emergency Medicine | Admitting: Emergency Medicine

## 2018-01-24 DIAGNOSIS — O418X10 Other specified disorders of amniotic fluid and membranes, first trimester, not applicable or unspecified: Secondary | ICD-10-CM

## 2018-01-24 DIAGNOSIS — O368390 Maternal care for abnormalities of the fetal heart rate or rhythm, unspecified trimester, not applicable or unspecified: Secondary | ICD-10-CM

## 2018-01-24 DIAGNOSIS — O468X1 Other antepartum hemorrhage, first trimester: Secondary | ICD-10-CM

## 2018-01-24 LAB — HCG, QUANTITATIVE, PREGNANCY: HCG, BETA CHAIN, QUANT, S: 49338 m[IU]/mL — AB (ref <5)

## 2018-01-24 NOTE — ED Notes (Addendum)
Patient transported to US 

## 2018-01-24 NOTE — ED Notes (Signed)
Patient r/f US 
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
## 2018-01-24 NOTE — Discharge Instructions (Addendum)
Your ultrasound shows that the baby's heart rate is very slow.  In addition, there is a small area of bleeding near your placenta.  These findings are concerning for possible miscarriage.  Rest and drink plenty of fluids daily.  Do not use tampons of your bleeding, douche or have sexual intercourse until seen by your doctor.  Return to the ER for vaginal bleeding, persistent vomiting, difficulty eating or other concerns.

## 2018-01-24 NOTE — ED Notes (Signed)
Patient up to stat desk inquiring about wait time for her and her family.  Explained process and reason for extended wait.  Patient verbalized understanding.

## 2018-01-24 NOTE — ED Notes (Signed)
ED Provider at bedside. 

## 2018-01-26 ENCOUNTER — Ambulatory Visit (INDEPENDENT_AMBULATORY_CARE_PROVIDER_SITE_OTHER): Payer: Medicaid Other | Admitting: Obstetrics & Gynecology

## 2018-01-26 VITALS — BP 110/60 | Wt 126.0 lb

## 2018-01-26 DIAGNOSIS — Z3A01 Less than 8 weeks gestation of pregnancy: Secondary | ICD-10-CM
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
## 2018-01-31 ENCOUNTER — Encounter: Payer: Self-pay | Admitting: Emergency Medicine

## 2018-01-31 ENCOUNTER — Other Ambulatory Visit: Payer: Self-pay | Admitting: Obstetrics & Gynecology

## 2018-01-31 ENCOUNTER — Emergency Department
Admission: EM | Admit: 2018-01-31 | Discharge: 2018-01-31 | Disposition: A | Discharge status: Home / Self Care | Payer: Medicaid Other | Attending: Emergency Medicine | Admitting: Emergency Medicine

## 2018-01-31 DIAGNOSIS — R109 Unspecified abdominal pain: Secondary | ICD-10-CM | POA: Diagnosis not present

## 2018-01-31 DIAGNOSIS — Z5321 Procedure and treatment not carried out due to patient leaving prior to being seen by health care provider: Secondary | ICD-10-CM | POA: Diagnosis not present

## 2018-01-31 DIAGNOSIS — O9989 Other specified diseases and conditions complicating pregnancy, childbirth and the puerperium: Secondary | ICD-10-CM | POA: Diagnosis present

## 2018-01-31 DIAGNOSIS — F1590 Other stimulant use, unspecified, uncomplicated: Secondary | ICD-10-CM | POA: Insufficient documentation

## 2018-01-31 DIAGNOSIS — W57XXXA Bitten or stung by nonvenomous insect and other nonvenomous arthropods, initial encounter: Secondary | ICD-10-CM | POA: Diagnosis not present

## 2018-01-31 DIAGNOSIS — Z3A08 8 weeks gestation of pregnancy: Secondary | ICD-10-CM | POA: Insufficient documentation

## 2018-01-31 DIAGNOSIS — Y939 Activity, unspecified: Secondary | ICD-10-CM | POA: Diagnosis not present

## 2018-01-31 DIAGNOSIS — O99331 Smoking (tobacco) complicating pregnancy, first trimester: Secondary | ICD-10-CM | POA: Diagnosis not present

## 2018-01-31 DIAGNOSIS — O23591 Infection of other part of genital tract in pregnancy, first trimester: Secondary | ICD-10-CM | POA: Insufficient documentation

## 2018-01-31 DIAGNOSIS — S30864A Insect bite (nonvenomous) of vagina and vulva, initial encounter: Secondary | ICD-10-CM | POA: Insufficient documentation

## 2018-01-31 DIAGNOSIS — F17210 Nicotine dependence, cigarettes, uncomplicated: Secondary | ICD-10-CM | POA: Insufficient documentation

## 2018-01-31 DIAGNOSIS — O99321 Drug use complicating pregnancy, first trimester: Secondary | ICD-10-CM | POA: Diagnosis not present

## 2018-01-31 DIAGNOSIS — Z79899 Other long term (current) drug therapy: Secondary | ICD-10-CM | POA: Diagnosis not present

## 2018-01-31 DIAGNOSIS — Y929 Unspecified place or not applicable: Secondary | ICD-10-CM | POA: Diagnosis not present

## 2018-01-31 DIAGNOSIS — Y998 Other external cause status: Secondary | ICD-10-CM | POA: Insufficient documentation

## 2018-01-31 LAB — CBC
HEMATOCRIT: 39.5 % (ref 35.0–47.0)
Hemoglobin: 13.8 g/dL (ref 12.0–16.0)
MCH: 33.4 pg (ref 26.0–34.0)
MCHC: 34.9 g/dL (ref 32.0–36.0)
MCV: 95.6 fL (ref 80.0–100.0)
Platelets: 138 10*3/uL — ABNORMAL LOW (ref 150–440)
RBC: 4.13 MIL/uL (ref 3.80–5.20)
RDW: 13 % (ref 11.5–14.5)
WBC: 5.6 10*3/uL (ref 3.6–11.0)

## 2018-01-31 LAB — COMPREHENSIVE METABOLIC PANEL
ALT: 14 U/L (ref 14–54)
AST: 18 U/L (ref 15–41)
Albumin: 4.5 g/dL (ref 3.5–5.0)
Alkaline Phosphatase: 49 U/L (ref 38–126)
Anion gap: 5 (ref 5–15)
BILIRUBIN TOTAL: 0.4 mg/dL (ref 0.3–1.2)
BUN: 14 mg/dL (ref 6–20)
CO2: 27 mmol/L (ref 22–32)
Calcium: 9.2 mg/dL (ref 8.9–10.3)
Chloride: 104 mmol/L (ref 101–111)
Creatinine, Ser: 0.73 mg/dL (ref 0.44–1.00)
Glucose, Bld: 106 mg/dL — ABNORMAL HIGH (ref 65–99)
Potassium: 4.2 mmol/L (ref 3.5–5.1)
Sodium: 136 mmol/L (ref 135–145)
TOTAL PROTEIN: 7.1 g/dL (ref 6.5–8.1)

## 2018-01-31 LAB — URINALYSIS, COMPLETE (UACMP) WITH MICROSCOPIC
Bacteria, UA: NONE SEEN
Bilirubin Urine: NEGATIVE
Glucose, UA: NEGATIVE mg/dL
Hgb urine dipstick: NEGATIVE
KETONES UR: NEGATIVE mg/dL
Leukocytes, UA: NEGATIVE
Nitrite: NEGATIVE
PH: 7 (ref 5.0–8.0)
Protein, ur: NEGATIVE mg/dL
SPECIFIC GRAVITY, URINE: 1.01 (ref 1.005–1.030)

## 2018-01-31 LAB — HCG, QUANTITATIVE, PREGNANCY: hCG, Beta Chain, Quant, S: 24114 m[IU]/mL — ABNORMAL HIGH (ref <5)

## 2018-01-31 NOTE — ED Triage Notes (Signed)
Pt in with co right upper quad pain that last night under right rib, states pain is constant but worse when she voids. Pt is [redacted] weeks pregnant and is having brownish discharge. Was here earlier today for possible spider bite to vaginal area but did not wait to be seen.

## 2018-01-31 NOTE — ED Triage Notes (Signed)
Pt reports has a spider bite on her vaginal area and now has some abdominal pain on the right side under her ribs and is worried that the spider bit gave her an infection.

## 2018-02-01 ENCOUNTER — Emergency Department
Admission: EM | Admit: 2018-02-01 | Discharge: 2018-02-01 | Disposition: A | Discharge status: Home / Self Care | Payer: Medicaid Other | Attending: Emergency Medicine | Admitting: Emergency Medicine

## 2018-02-01 ENCOUNTER — Ambulatory Visit (INDEPENDENT_AMBULATORY_CARE_PROVIDER_SITE_OTHER): Payer: Medicaid Other

## 2018-02-01 ENCOUNTER — Other Ambulatory Visit: Payer: Self-pay

## 2018-02-01 ENCOUNTER — Ambulatory Visit (INDEPENDENT_AMBULATORY_CARE_PROVIDER_SITE_OTHER): Payer: Medicaid Other | Admitting: Obstetrics and Gynecology

## 2018-02-01 ENCOUNTER — Telehealth: Payer: Self-pay

## 2018-02-01 ENCOUNTER — Emergency Department: Payer: Medicaid Other

## 2018-02-01 ENCOUNTER — Encounter: Payer: Self-pay | Admitting: Obstetrics and Gynecology

## 2018-02-01 VITALS — BP 120/80 | Wt 126.0 lb

## 2018-02-01 DIAGNOSIS — R109 Unspecified abdominal pain: Secondary | ICD-10-CM

## 2018-02-01 DIAGNOSIS — O021 Missed abortion: Secondary | ICD-10-CM

## 2018-02-01 DIAGNOSIS — B9689 Other specified bacterial agents as the cause of diseases classified elsewhere: Secondary | ICD-10-CM

## 2018-02-01 DIAGNOSIS — N760 Acute vaginitis: Secondary | ICD-10-CM

## 2018-02-01 DIAGNOSIS — Z3A01 Less than 8 weeks gestation of pregnancy: Secondary | ICD-10-CM | POA: Diagnosis not present

## 2018-02-01 LAB — WET PREP, GENITAL
Sperm: NONE SEEN
Trich, Wet Prep: NONE SEEN
Yeast Wet Prep HPF POC: NONE SEEN

## 2018-02-01 LAB — CHLAMYDIA/NGC RT PCR (ARMC ONLY)
CHLAMYDIA TR: NOT DETECTED
N GONORRHOEAE: NOT DETECTED

## 2018-02-01 MED ORDER — METRONIDAZOLE 500 MG PO TABS
500.0000 mg | ORAL_TABLET | Freq: Once | ORAL | Status: AC
  Administered 2018-02-01: 500 mg via ORAL
  Filled 2018-02-01: qty 1

## 2018-02-01 MED ORDER — MISOPROSTOL 200 MCG PO TABS: ORAL_TABLET | ORAL | 0 refills | Status: DC

## 2018-02-01 MED ORDER — METRONIDAZOLE 500 MG PO TABS: 500.0000 mg | ORAL_TABLET | Freq: Two times a day (BID) | ORAL | 0 refills | Status: AC

## 2018-02-01 NOTE — Telephone Encounter (Signed)
Pt had called after hours triage last night. Called this AM to check on her. Pt has appt today at 10:30 for u/s and follow up.
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
## 2018-02-01 NOTE — Discharge Instructions (Signed)
Your ultrasound is negative.  Please follow-up with [facility] for further evaluation of your vaginal blisters.  Please return with any other concerns.
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
## 2018-02-01 NOTE — Progress Notes (Signed)
Patient ID: Laurie Oliver, female   DOB: [birth year], 32 y.o.   MRN: [id number]  Reason for Consult: Miscarriage   Referred by Dan Humphreys, [facility]  Subjective:     HPI:  Laurie Oliver is a 32 y.o. female.  She presents today for follow up. She has a missed abortion. No growth since two weeks ago. No fetal heart beat seen. Patient has had small amount of vaginal bleeding, mostly pelvic pain and discomfort. Was seen in the ER and given flagyl last night for bacterial vaginosis.  No fevers.  Past Medical History:  Diagnosis Date  . Bacterial vaginosis   . Bartholin's gland abscess   . Heart murmur   . Hepatitis C    Family History  Problem Relation Age of Onset  . Ovarian cancer Mother   . Hypertension Maternal Grandmother   . Hypertension Maternal Grandfather   . Heart disease Maternal Grandfather   . Lung cancer Paternal Grandmother    Past Surgical History:  Procedure Laterality Date  .  Barholins gland abscess drainage      Short Social History:  Social History   Tobacco Use  . Smoking status: Current Every Day Smoker    Packs/day: 1.00    Types: Cigarettes  . Smokeless tobacco: Never Used  Substance Use Topics  . Alcohol use: No    Comment: Subutex current, Hx of Cannabis and Opioid     Allergies  Allergen Reactions  . Motrin [Ibuprofen] Nausea And Vomiting  . Aspirin Swelling  . Benzalkonium Chloride Swelling  . Neosporin [Neomycin-Bacitracin Zn-Polymyx] Swelling    Current Outpatient Medications  Medication Sig Dispense Refill  . buprenorphine (SUBUTEX) 8 MG SUBL SL tablet Place 4 mg under the tongue 2 (two) times daily.     . buprenorphine-naloxone (SUBOXONE) 2-0.5 MG SUBL SL tablet Place 1 tablet under the tongue daily.    . metroNIDAZOLE (FLAGYL) 500 MG tablet Take 1 tablet (500 mg total) by mouth 2 (two) times daily for 7 days. 14 tablet 0  . misoprostol (CYTOTEC) 200 MCG tablet Place four tablets in between your gums and cheeks (two tablets  on each side) as instructed OR insert four tablets vaginally, repeat every 6-hrs as needed 12 tablet 0   No current facility-administered medications for this visit.     Review of Systems  Constitutional: Negative for chills, fatigue, fever and unexpected weight change.  HENT: Negative for trouble swallowing.  Eyes: Negative for loss of vision.  Respiratory: Negative for cough, shortness of breath and wheezing.  Cardiovascular: Negative for chest pain, leg swelling, palpitations and syncope.  GI: Negative for abdominal pain, blood in stool, diarrhea, nausea and vomiting.  GU: Negative for difficulty urinating, dysuria, frequency and hematuria.  Musculoskeletal: Negative for back pain, leg pain and joint pain.  Skin: Negative for rash.  Neurological: Negative for dizziness, headaches, light-headedness, numbness and seizures.  Psychiatric: Negative for behavioral problem, confusion, depressed mood and sleep disturbance.        Objective:  Objective   Vitals:   02/01/18 1113  BP: 120/80  Weight: 126 lb (57.2 kg)   Body mass index is 24.61 kg/m.  Physical Exam  Constitutional: She is oriented to person, place, and time. She appears well-developed and well-nourished.  HENT:  Head: Normocephalic and atraumatic.  Eyes: EOM are normal.  Cardiovascular: Normal rate and regular rhythm.  Pulmonary/Chest: Effort normal and breath sounds normal.  Abdominal: Soft. She exhibits no distension and no mass. There is tenderness.  There is no rebound. No hernia.  Neurological: She is alert and oriented to person, place, and time.  Skin: Skin is warm and dry.  Psychiatric: She has a normal mood and affect. Her behavior is normal. Judgment and thought content normal.  Nursing note and vitals reviewed.      Assessment/Plan:     Patient desires medical management of missed abortion. Cytotec prescribed, instructions for use discussed. Patient given information for grief counseling and a packet  of information about miscarriages from [employer]. Rh positive. Discussed bleeding precautions. Follow up in 1 week for [place] to confirm passage of pregnancy.  Adelene Idler MD [facility], [facility] 02/01/18 1:39 PM

## 2018-02-10 ENCOUNTER — Ambulatory Visit (INDEPENDENT_AMBULATORY_CARE_PROVIDER_SITE_OTHER): Payer: Medicaid Other | Admitting: Obstetrics and Gynecology

## 2018-02-10 ENCOUNTER — Encounter: Payer: Self-pay | Admitting: Obstetrics and Gynecology

## 2018-02-10 VITALS — BP 104/66 | HR 98 | Wt 124.0 lb

## 2018-02-10 DIAGNOSIS — O039 Complete or unspecified spontaneous abortion without complication: Secondary | ICD-10-CM | POA: Diagnosis not present
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
## 2018-02-15 ENCOUNTER — Encounter: Payer: Medicaid Other | Admitting: Obstetrics and Gynecology

## 2018-08-23 ENCOUNTER — Other Ambulatory Visit: Payer: Self-pay

## 2018-08-23 ENCOUNTER — Emergency Department
Admission: EM | Admit: 2018-08-23 | Discharge: 2018-08-23 | Disposition: A | Discharge status: Home / Self Care | Payer: Medicaid Other | Attending: Emergency Medicine | Admitting: Emergency Medicine

## 2018-08-23 DIAGNOSIS — F17210 Nicotine dependence, cigarettes, uncomplicated: Secondary | ICD-10-CM | POA: Diagnosis not present

## 2018-08-23 DIAGNOSIS — T63441A Toxic effect of venom of bees, accidental (unintentional), initial encounter: Secondary | ICD-10-CM

## 2018-08-23 DIAGNOSIS — Z79899 Other long term (current) drug therapy: Secondary | ICD-10-CM | POA: Diagnosis not present

## 2018-08-23 DIAGNOSIS — S20361A Insect bite (nonvenomous) of right front wall of thorax, initial encounter: Secondary | ICD-10-CM | POA: Insufficient documentation

## 2018-08-23 DIAGNOSIS — W57XXXA Bitten or stung by nonvenomous insect and other nonvenomous arthropods, initial encounter: Secondary | ICD-10-CM | POA: Insufficient documentation

## 2018-08-23 DIAGNOSIS — Y929 Unspecified place or not applicable: Secondary | ICD-10-CM | POA: Insufficient documentation

## 2018-08-23 DIAGNOSIS — Y998 Other external cause status: Secondary | ICD-10-CM | POA: Insufficient documentation

## 2018-08-23 DIAGNOSIS — Y9389 Activity, other specified: Secondary | ICD-10-CM | POA: Insufficient documentation

## 2018-08-23 NOTE — ED Triage Notes (Signed)
Pt c/o being stung by yellow jacket on Thursday. Pt states that "the stinger is still in there". C/o pain that is interfering with job.

## 2018-08-23 NOTE — ED Notes (Signed)
Pt states that yellow jacket stinger is still inside her. Pt states that yellow jacket stung her Thursday on her right chest area. Pt states that she is having arm pain and right sided neck pain since she was stung. Pt states she has slight itchiness around sting area.

## 2018-08-23 NOTE — ED Provider Notes (Signed)
[facility] Emergency Department Provider Note  ____________________________________________  Time seen: Approximately 7:42 PM  I have reviewed the triage vital signs and the nursing notes.   HISTORY  Chief Complaint Insect Bite   HPI Laurie Oliver is a 32 y.o. female presenting to the emergency department for treatment and evaluation of a yellowjacket sting that happened 5 days ago to her right chest wall.  She is having arm pain and right-sided neck pain since she was stung.  She thinks that the stinger is still in there.  Pain is interfering with her job.   Past Medical History:  Diagnosis Date  . Bacterial vaginosis   . Bartholin's gland abscess   . Heart murmur   . Hepatitis C     Patient Active Problem List   Diagnosis Date Noted  . Hepatitis C, chronic (HCC) 01/14/2018  . Supervision of high risk pregnancy, antepartum 01/14/2018    Past Surgical History:  Procedure Laterality Date  .  Barholins gland abscess drainage      Prior to Admission medications   Medication Sig Start Date End Date Taking? Authorizing Provider  buprenorphine (SUBUTEX) 8 MG SUBL SL tablet Place 4 mg under the tongue 2 (two) times daily.     [provider]  buprenorphine-naloxone (SUBOXONE) 2-0.5 MG SUBL SL tablet Place 1 tablet under the tongue daily.    [provider]  misoprostol (CYTOTEC) 200 MCG tablet Place four tablets in between your gums and cheeks (two tablets on each side) as instructed OR insert four tablets vaginally, repeat every 6-hrs as needed 02/01/18   Natale MilchSchuman, Christanna R, MD    Allergies Motrin [ibuprofen]; Aspirin; Benzalkonium chloride; and Neosporin [neomycin-bacitracin zn-polymyx]  Family History  Problem Relation Age of Onset  . Ovarian cancer Mother   . Hypertension Maternal Grandmother   . Hypertension Maternal Grandfather   . Heart disease Maternal Grandfather   . Lung cancer Paternal Grandmother     Social  History Social History   Tobacco Use  . Smoking status: Current Every Day Smoker    Packs/day: 1.00    Types: Cigarettes  . Smokeless tobacco: Never Used  Substance Use Topics  . Alcohol use: No    Comment: Subutex current, Hx of Cannabis and Opioid   . Drug use: Yes    Types: Other-see comments    Review of Systems  Constitutional: Negative for fever. Respiratory: Negative for cough or shortness of breath.  Musculoskeletal: Negative for myalgias Skin: Positive for lesion on the chest wall. Neurological: Negative for numbness or paresthesias. ____________________________________________   PHYSICAL EXAM:  VITAL SIGNS: ED Triage Vitals  Enc Vitals Group     BP 08/23/18 1755 127/76     Pulse Rate 08/23/18 1755 89     Resp 08/23/18 1755 18     Temp 08/23/18 1755 97.7 F (36.5 C)     Temp Source 08/23/18 1755 Oral     SpO2 08/23/18 1755 97 %     Weight 08/23/18 1756 128 lb (58.1 kg)     Height 08/23/18 1756 5' (1.524 m)     Head Circumference --      Peak Flow --      Pain Score 08/23/18 1756 9     Pain Loc --      Pain Edu? --      Excl. in GC? --      Constitutional: Well appearing. Eyes: Conjunctivae are clear without discharge or drainage. Nose: No rhinorrhea noted. Mouth/Throat:  Airway is patent.  Neck: No stridor. Unrestricted range of motion observed. Cardiovascular: Capillary refill is <3 seconds.  Respiratory: Respirations are even and unlabored.. Musculoskeletal: Unrestricted range of motion observed. Neurologic: Awake, alert, and oriented x 4.  Skin: Erythematous, raised lesion with a pinpoint darkened area consistent with report of bee sting present on the right chest wall.  No surrounding erythema.  No drainage from the lesion.  ____________________________________________   LABS (all labs ordered are listed, but only abnormal results are displayed)  Labs Reviewed - No data to display ____________________________________________  EKG  Not  indicated. ____________________________________________  RADIOLOGY  Not indicated ____________________________________________   PROCEDURES  Procedures: See below ____________________________________________   INITIAL IMPRESSION / ASSESSMENT AND PLAN / ED COURSE  VENDELA Oliver is a 32 y.o. female presenting to the emergency department for treatment and evaluation of bee sting that occurred Thursday.  Patient feels that the stinger is still in the skin and she has been unable to remove it.  She feels that this is causing her right arm to hurt.  While here, the skin was cleansed with alcohol and a 25-gauge needle was used to remove the pinpoint dark area in the center of the lesion.  It did appear that a tiny, clear/white object came from the center of the wound that would be consistent with a stinger.  Patient was encouraged to put hydrocortisone cream over the area.  She was also advised she could use some Benadryl if needed.  She was advised to follow-up with primary care or return to the emergency department for symptoms of concern.   Medications - No data to display   Pertinent labs & imaging results that were available during my care of the patient were reviewed by me and considered in my medical decision making (see chart for details).  ____________________________________________   FINAL CLINICAL IMPRESSION(S) / ED DIAGNOSES  Final diagnoses:  Bee sting, accidental or unintentional, initial encounter    ED Discharge Orders    None       Note:  This document was prepared using Dragon voice recognition software and may include unintentional dictation errors.    Chinita Pester, FNP 08/23/18 1954    Nita Sickle, MD 08/25/18 [phone]

## 2018-08-23 NOTE — Discharge Instructions (Signed)
Apply hydrocortisone cream to the area 2 times per day.  You may take Benadryl if needed for itching.  Follow-up with primary care for symptoms that are not improving over the next couple of days.  Return to the emergency department for symptoms of change or worsen if you are unable to schedule an appointment.

## 2018-09-21 NOTE — L&D Delivery Note (Signed)
Date of delivery: 05/08/2019 Estimated Date of Delivery: 05/15/19 Patient's last menstrual period was 09/09/2018 (within days). EGA: [redacted]w[redacted]d  Delivery Note At 9:12 PM a viable female was delivered via Vaginal, Spontaneous (Presentation: OA; LOA).  APGAR: 9, 9; weight:  2580 g, 5#11oz.   Placenta status: spontaneous, intact.  Cord:  with the following complications: velamentous insertion.  Cord pH: NA  Called to see patient.  Mom pushed for 3 minutes to deliver a viable female infant.  The head followed by shoulders, which delivered without difficulty, and the rest of the body.  No nuchal cord noted.  Baby to mom's chest.  Cord clamped and cut after 3 min delay.  Cord blood obtained.  Placenta delivered spontaneously, intact, with a 3-vessel cord.  No perineal, vaginal or cervical lacerations.  All counts correct.  Hemostasis obtained with IV pitocin and fundal massage.   Anesthesia: epidural  Episiotomy: None Lacerations:  None Suture Repair: NA Est. Blood Loss (mL): 150  Mom to postpartum.  Baby to Couplet care / Skin to Skin.  Rod Can, CNM 05/08/2019, 9:52 PM

## 2018-11-02 ENCOUNTER — Other Ambulatory Visit (HOSPITAL_COMMUNITY)
Admission: RE | Admit: 2018-11-02 | Discharge: 2018-11-02 | Disposition: A | Discharge status: Home / Self Care | Payer: Medicaid Other | Source: Ambulatory Visit | Attending: Obstetrics and Gynecology | Admitting: Obstetrics and Gynecology

## 2018-11-02 ENCOUNTER — Ambulatory Visit (INDEPENDENT_AMBULATORY_CARE_PROVIDER_SITE_OTHER): Payer: Medicaid Other | Admitting: Obstetrics and Gynecology

## 2018-11-02 ENCOUNTER — Encounter: Payer: Self-pay | Admitting: Obstetrics and Gynecology

## 2018-11-02 ENCOUNTER — Ambulatory Visit (INDEPENDENT_AMBULATORY_CARE_PROVIDER_SITE_OTHER): Payer: Medicaid Other

## 2018-11-02 VITALS — BP 110/70 | Wt 119.0 lb

## 2018-11-02 DIAGNOSIS — O99331 Smoking (tobacco) complicating pregnancy, first trimester: Secondary | ICD-10-CM

## 2018-11-02 DIAGNOSIS — Z124 Encounter for screening for malignant neoplasm of cervix: Secondary | ICD-10-CM

## 2018-11-02 DIAGNOSIS — O0990 Supervision of high risk pregnancy, unspecified, unspecified trimester: Secondary | ICD-10-CM | POA: Insufficient documentation

## 2018-11-02 DIAGNOSIS — Z3A01 Less than 8 weeks gestation of pregnancy: Secondary | ICD-10-CM

## 2018-11-02 DIAGNOSIS — O99310 Alcohol use complicating pregnancy, unspecified trimester: Secondary | ICD-10-CM

## 2018-11-02 DIAGNOSIS — Z113 Encounter for screening for infections with a predominantly sexual mode of transmission: Secondary | ICD-10-CM

## 2018-11-02 DIAGNOSIS — O3481 Maternal care for other abnormalities of pelvic organs, first trimester: Secondary | ICD-10-CM

## 2018-11-02 DIAGNOSIS — N912 Amenorrhea, unspecified: Secondary | ICD-10-CM

## 2018-11-02 DIAGNOSIS — B182 Chronic viral hepatitis C: Secondary | ICD-10-CM | POA: Diagnosis not present

## 2018-11-02 DIAGNOSIS — O99330 Smoking (tobacco) complicating pregnancy, unspecified trimester: Secondary | ICD-10-CM | POA: Insufficient documentation

## 2018-11-02 DIAGNOSIS — Z3A12 12 weeks gestation of pregnancy: Secondary | ICD-10-CM | POA: Diagnosis not present

## 2018-11-02 DIAGNOSIS — N8311 Corpus luteum cyst of right ovary: Secondary | ICD-10-CM | POA: Diagnosis not present

## 2018-11-02 DIAGNOSIS — O99311 Alcohol use complicating pregnancy, first trimester: Secondary | ICD-10-CM | POA: Diagnosis not present

## 2018-11-02 DIAGNOSIS — Z3201 Encounter for pregnancy test, result positive: Secondary | ICD-10-CM | POA: Diagnosis not present

## 2018-11-02 DIAGNOSIS — F1010 Alcohol abuse, uncomplicated: Secondary | ICD-10-CM

## 2018-11-02 LAB — POCT URINALYSIS DIPSTICK OB
Glucose, UA: NEGATIVE
POC,PROTEIN,UA: NEGATIVE

## 2018-11-02 LAB — OB RESULTS CONSOLE VARICELLA ZOSTER ANTIBODY, IGG: Varicella: IMMUNE

## 2018-11-02 LAB — POCT URINE PREGNANCY: Preg Test, Ur: POSITIVE — AB

## 2018-11-02 MED ORDER — VITAFOL FE+ 90-0.6-0.4-200 MG PO CAPS: 1.0000 | ORAL_CAPSULE | Freq: Every day | ORAL | 11 refills | Status: DC

## 2018-11-02 NOTE — Progress Notes (Signed)
11/02/2018   Chief Complaint: Missed period  Transfer of Care Patient: no  History of Present Illness: Ms. Laurie Oliver is a 33 y.o. [id number] 180w5d based on Patient's last menstrual period was 09/09/2018 (within days). with an Estimated Date of Delivery: 06/16/19, with the above CC.   Her periods were: regular periods every 28 days She was using no method when she conceived.  She has Negative signs or symptoms of nausea/vomiting of pregnancy. She has Negative signs or symptoms of miscarriage or preterm labor She identifies Negative Zika risk factors for her and her partner On any different medications around the time she conceived/early pregnancy: Addreall abuse and daily alcohol consumption History of varicella: Yes   ROS: A 12-point review of systems was performed and negative, except as stated in the above HPI.  OBGYN History: As per HPI. OB History  Gravida Para Term Preterm AB Living  5 1 1   2 1   SAB TAB Ectopic Multiple Live Births  2     0 1    # Outcome Date GA Lbr Len/2nd Weight Sex Delivery Anes PTL Lv  5 Current           4 Term 04/07/15 6626w1d / 00:13 4 lb 14.7 oz (2.23 kg) F Vag-Spont None  LIV  3 Gravida           2 SAB           1 SAB             Any issues with any prior pregnancies: no Any prior children are healthy, doing well, without any problems or issues: yes History of pap smears: Yes. Last pap smear 2016. Abnormal: no   History of STIs: No   Past Medical History: Past Medical History:  Diagnosis Date  . Bacterial vaginosis   . Bartholin's gland abscess   . Heart murmur   . Hepatitis C     Past Surgical History: Past Surgical History:  Procedure Laterality Date  .  Barholins gland abscess drainage      Family History:  Family History  Problem Relation Age of Onset  . Ovarian cancer Mother   . Hypertension Maternal Grandmother   . Hypertension Maternal Grandfather   . Heart disease Maternal Grandfather   . Lung cancer Paternal Grandmother     She denies any female cancers, bleeding or blood clotting disorders.  She denies any history of mental retardation, birth defects or genetic disorders in her or the FOB's history  Social History:  Social History   Socioeconomic History  . Marital status: Married    Spouse name: Not on file  . Number of children: Not on file  . Years of education: Not on file  . Highest education level: Not on file  Occupational History  . Not on file  Social Needs  . Financial resource strain: Not on file  . Food insecurity:    Worry: Not on file    Inability: Not on file  . Transportation needs:    Medical: Not on file    Non-medical: Not on file  Tobacco Use  . Smoking status: Current Every Day Smoker    Packs/day: 1.00    Types: Cigarettes  . Smokeless tobacco: Never Used  Substance and Sexual Activity  . Alcohol use: No    Comment: Subutex current, Hx of Cannabis and Opioid   . Drug use: Yes    Types: Other-see comments  . Sexual activity: Yes    Birth control/protection:  None  Lifestyle  . Physical activity:    Days per week: Not on file    Minutes per session: Not on file  . Stress: Not on file  Relationships  . Social connections:    Talks on phone: Not on file    Gets together: Not on file    Attends religious service: Not on file    Active member of club or organization: Not on file    Attends meetings of clubs or organizations: Not on file    Relationship status: Not on file  . Intimate partner violence:    Fear of current or ex partner: Not on file    Emotionally abused: Not on file    Physically abused: Not on file    Forced sexual activity: Not on file  Other Topics Concern  . Not on file  Social History Narrative  . Not on file   Any pets in the household: no    Allergy: Allergies  Allergen Reactions  . Motrin [Ibuprofen] Nausea And Vomiting  . Aspirin Swelling  . Benzalkonium Chloride Swelling  . Neosporin [Neomycin-Bacitracin Zn-Polymyx] Swelling     Current Outpatient Medications:  Current Outpatient Medications:  .  buprenorphine (SUBUTEX) 8 MG SUBL SL tablet, Place 4 mg under the tongue 2 (two) times daily. , Disp: , Rfl:    Physical Exam:   BP 110/70   Wt 119 lb (54 kg)   LMP 09/09/2018 (Within Days)   BMI 23.24 kg/m  Body mass index is 23.24 kg/m. Constitutional: Well nourished, well developed female in no acute distress.  Neck:  Supple, normal appearance, and no thyromegaly  Cardiovascular: S1, S2 normal, no murmur, rub or gallop, regular rate and rhythm Respiratory:  Clear to auscultation bilateral. Normal respiratory effort Abdomen: positive bowel sounds and no masses, hernias; diffusely non tender to palpation, non distended Breasts: breasts appear normal, no suspicious masses, no skin or nipple changes or axillary nodes. Neuro/Psych:  Normal mood and affect.  Skin:  Warm and dry.  Lymphatic:  No inguinal lymphadenopathy.   Pelvic exam: is not limited by body habitus EGBUS: within normal limits, Vagina: within normal limits and with no blood in the vault, Cervix: normal appearing cervix without discharge or lesions, closed/long/high, Uterus:  enlarged: 13 cm, and Adnexa:  normal adnexa  Assessment: Ms. Laurie Oliver is a 33 y.o. [id number] 6343w5d based on Patient's last menstrual period was 09/09/2018 (within days). with an Estimated Date of Delivery: 06/16/19,  for prenatal care.  Plan:  1) Avoid alcoholic beverages. 2) Patient encouraged not to smoke.  3) Discontinue the use of all non-medicinal drugs and chemicals.  4) Take prenatal vitamins daily.  5) Seatbelt use advised 6) Nutrition, food safety (fish, cheese advisories, and high nitrite foods) and exercise discussed. 7) Hospital and practice style delivering [facility] discussed  8) Patient is asked about travel to areas at risk for the Zika virus, and counseled to avoid travel and exposure to mosquitoes or sexual partners who may have themselves been exposed to the  virus. Testing is discussed, and will be ordered as appropriate.  9) [facility] advised 10) Genetic Screening, such as with 1st Trimester Screening, cell free fetal DNA, AFP testing, and Ultrasound, as well as with amniocentesis and CVS as appropriate, is discussed with patient. She plans to have genetic testing this pregnancy.  Boost Prescription sent to pharmacy NOB labs today Pap today Declines flu vaccination Dating US today- further along than expected, will return for  Maternit21 testing tomorrow. Has been treated for Hepatitis C in the past, had records faxed from her [facility] doctor, these will need to be reviewed   Patient reports that she abuses alcohol and Adderall. She said. "I flushed all my Adderall down the toilet yesterday and I stopped using alcohol as soon as I found out I was pregnant last week. " "I am going to go to the [facility] so I can be started on subutex."    When I asked her why she would start subutex without currently using any type of opiods she told me "I just need it to keep me calm because I normally drink a lot."  Problem list reviewed and updated.  Adelene Idler MD [facility], [facility] 11/02/2018  2:32 PM

## 2018-11-02 NOTE — Progress Notes (Signed)
NOB C/o fatigue, some pain in RLQ, very irritable  Declines the flu shot

## 2018-11-03 ENCOUNTER — Other Ambulatory Visit: Payer: Medicaid Other

## 2018-11-03 ENCOUNTER — Other Ambulatory Visit: Payer: Self-pay | Admitting: Obstetrics and Gynecology

## 2018-11-03 DIAGNOSIS — Z3A12 12 weeks gestation of pregnancy: Secondary | ICD-10-CM

## 2018-11-03 DIAGNOSIS — O0990 Supervision of high risk pregnancy, unspecified, unspecified trimester: Secondary | ICD-10-CM

## 2018-11-03 DIAGNOSIS — Z31438 Encounter for other genetic testing of female for procreative management: Secondary | ICD-10-CM

## 2018-11-03 DIAGNOSIS — O99310 Alcohol use complicating pregnancy, unspecified trimester: Secondary | ICD-10-CM

## 2018-11-03 DIAGNOSIS — F1010 Alcohol abuse, uncomplicated: Secondary | ICD-10-CM

## 2018-11-03 DIAGNOSIS — Z1379 Encounter for other screening for genetic and chromosomal anomalies: Secondary | ICD-10-CM

## 2018-11-03 LAB — RPR+RH+ABO+RUB AB+AB SCR+CB...
ANTIBODY SCREEN: NEGATIVE
HEMATOCRIT: 39.1 % (ref 34.0–46.6)
HIV SCREEN 4TH GENERATION: NONREACTIVE
Hemoglobin: 13.4 g/dL (ref 11.1–15.9)
Hepatitis B Surface Ag: NEGATIVE
MCH: 33.6 pg — AB (ref 26.6–33.0)
MCHC: 34.3 g/dL (ref 31.5–35.7)
MCV: 98 fL — AB (ref 79–97)
PLATELETS: 193 10*3/uL (ref 150–450)
RBC: 3.99 x10E6/uL (ref 3.77–5.28)
RDW: 12.6 % (ref 11.7–15.4)
RH TYPE: POSITIVE
RPR: NONREACTIVE
RUBELLA: 1.38 {index} (ref >0.99)
Varicella zoster IgG: 1257 index (ref >165)
WBC: 8.5 10*3/uL (ref 3.4–10.8)

## 2018-11-04 LAB — COMPREHENSIVE METABOLIC PANEL
ALBUMIN: 4.2 g/dL (ref 3.8–4.8)
ALK PHOS: 63 IU/L (ref 39–117)
ALT: 14 IU/L (ref 0–32)
AST: 16 IU/L (ref 0–40)
Albumin/Globulin Ratio: 2.1 (ref 1.2–2.2)
BUN / CREAT RATIO: 23 (ref 9–23)
BUN: 14 mg/dL (ref 6–20)
Bilirubin Total: 0.2 mg/dL (ref 0.0–1.2)
CALCIUM: 8.9 mg/dL (ref 8.7–10.2)
CO2: 23 mmol/L (ref 20–29)
CREATININE: 0.62 mg/dL (ref 0.57–1.00)
Chloride: 101 mmol/L (ref 96–106)
GFR calc Af Amer: 139 mL/min/{1.73_m2} (ref >59)
GFR, EST NON AFRICAN AMERICAN: 121 mL/min/{1.73_m2} (ref >59)
GLUCOSE: 93 mg/dL (ref 65–99)
Globulin, Total: 2 g/dL (ref 1.5–4.5)
Potassium: 4.6 mmol/L (ref 3.5–5.2)
Sodium: 134 mmol/L (ref 134–144)
Total Protein: 6.2 g/dL (ref 6.0–8.5)

## 2018-11-04 LAB — URINE CYTOLOGY ANCILLARY ONLY
CHLAMYDIA, DNA PROBE: NEGATIVE
Neisseria Gonorrhea: NEGATIVE
Trichomonas: NEGATIVE

## 2018-11-05 LAB — URINE CULTURE: Organism ID, Bacteria: NO GROWTH

## 2018-11-07 LAB — CYTOLOGY - PAP
DIAGNOSIS: NEGATIVE
HPV (WINDOPATH): NOT DETECTED

## 2018-11-08 LAB — MATERNIT 21 PLUS CORE, BLOOD
CHROMOSOME 18: NEGATIVE
Chromosome 13: NEGATIVE
Chromosome 21: NEGATIVE
Fetal Fraction: 14
Y CHROMOSOME: NOT DETECTED

## 2018-11-08 NOTE — Progress Notes (Signed)
Negative, Released to mychart 

## 2018-11-09 LAB — MONITOR DRUG PROFILE 10(MW)
Amphetamine Scrn, Ur: NEGATIVE ng/mL
BARBITURATE SCREEN URINE: NEGATIVE ng/mL
BENZODIAZEPINE SCREEN, URINE: NEGATIVE ng/mL
Cocaine (Metab) Scrn, Ur: NEGATIVE ng/mL
Creatinine(Crt), U: 87.3 mg/dL (ref 20.0–300.0)
METHADONE SCREEN, URINE: NEGATIVE ng/mL
OPIATE SCREEN URINE: NEGATIVE ng/mL
OXYCODONE+OXYMORPHONE UR QL SCN: NEGATIVE ng/mL
PROPOXYPHENE SCREEN URINE: NEGATIVE ng/mL
Ph of Urine: 8.8 (ref 4.5–8.9)
Phencyclidine Qn, Ur: NEGATIVE ng/mL

## 2018-11-09 LAB — CANNABINOID (GC/MS), URINE

## 2018-11-12 ENCOUNTER — Emergency Department
Admission: EM | Admit: 2018-11-12 | Discharge: 2018-11-12 | Disposition: A | Discharge status: Home / Self Care | Payer: Medicaid Other | Attending: Emergency Medicine | Admitting: Emergency Medicine

## 2018-11-12 ENCOUNTER — Other Ambulatory Visit: Payer: Self-pay

## 2018-11-12 ENCOUNTER — Encounter: Payer: Self-pay | Admitting: Emergency Medicine

## 2018-11-12 DIAGNOSIS — J329 Chronic sinusitis, unspecified: Secondary | ICD-10-CM | POA: Diagnosis not present

## 2018-11-12 DIAGNOSIS — F17210 Nicotine dependence, cigarettes, uncomplicated: Secondary | ICD-10-CM | POA: Diagnosis not present

## 2018-11-12 DIAGNOSIS — Z3A14 14 weeks gestation of pregnancy: Secondary | ICD-10-CM | POA: Insufficient documentation

## 2018-11-12 DIAGNOSIS — N760 Acute vaginitis: Secondary | ICD-10-CM | POA: Insufficient documentation

## 2018-11-12 DIAGNOSIS — O26892 Other specified pregnancy related conditions, second trimester: Secondary | ICD-10-CM | POA: Diagnosis present

## 2018-11-12 DIAGNOSIS — Z79899 Other long term (current) drug therapy: Secondary | ICD-10-CM | POA: Diagnosis not present

## 2018-11-12 DIAGNOSIS — B9689 Other specified bacterial agents as the cause of diseases classified elsewhere: Secondary | ICD-10-CM | POA: Diagnosis not present

## 2018-11-12 LAB — URINALYSIS, COMPLETE (UACMP) WITH MICROSCOPIC
BACTERIA UA: NONE SEEN
Bilirubin Urine: NEGATIVE
Glucose, UA: NEGATIVE mg/dL
Hgb urine dipstick: NEGATIVE
Ketones, ur: NEGATIVE mg/dL
Leukocytes,Ua: NEGATIVE
NITRITE: NEGATIVE
PROTEIN: NEGATIVE mg/dL
SPECIFIC GRAVITY, URINE: 1.019 (ref 1.005–1.030)
pH: 7 (ref 5.0–8.0)

## 2018-11-12 LAB — WET PREP, GENITAL
SPERM: NONE SEEN
TRICH WET PREP: NONE SEEN
YEAST WET PREP: NONE SEEN

## 2018-11-12 LAB — CHLAMYDIA/NGC RT PCR (ARMC ONLY)
Chlamydia Tr: NOT DETECTED
N gonorrhoeae: NOT DETECTED

## 2018-11-12 MED ORDER — METRONIDAZOLE 500 MG PO TABS: 500.0000 mg | ORAL_TABLET | Freq: Two times a day (BID) | ORAL | 0 refills | Status: DC

## 2018-11-12 MED ORDER — AMOXICILLIN-POT CLAVULANATE 875-125 MG PO TABS: 1.0000 | ORAL_TABLET | Freq: Two times a day (BID) | ORAL | 0 refills | Status: DC

## 2018-11-12 MED ORDER — FLUCONAZOLE 150 MG PO TABS: 150.0000 mg | ORAL_TABLET | Freq: Once | ORAL | 0 refills | Status: AC

## 2018-11-12 NOTE — ED Triage Notes (Signed)
Cough x 5 days. On cefdinir and methylprednisolone and buprenorphine. States concerned about rash but not visualized by this nurse. No resp distress. [redacted] weeks pregnant with no bleeding or fluid or cramps.

## 2018-11-12 NOTE — ED Provider Notes (Signed)
[facility] Emergency Department Provider Note  ____________________________________________  Time seen: Approximately 4:41 PM  I have reviewed the triage vital signs and the nursing notes.   HISTORY  Chief Complaint Cough    HPI Laurie Oliver is a 33 y.o. female who presents the emergency department complaining of multiple complaints.  Patient has had URI symptoms for approximately a week.  Patient was evaluated at urgent care and started on Omnicef.  Patient reports that symptoms have not drastically improved.  She still has coughing and she is also ask parents think sinus pressure.  Patient denies any significant fevers or chills.  Patient has had some intermittent hives prior to starting antibiotic as well as hives well taking the antibiotic.  At this point, patient states that while in the emergency department she does not have any significant hives but they do wax and wane.  Patient is G5, P1.  Patient is complaining of vaginal discharge as well as a slight left flank pain and dysuria.  Patient denies any hematuria.  She denies any vaginal bleeding.  Patient denies any pelvic pain and states that the pain is primarily in the left flank.   No history of recurrent UTIs.   Past Medical History:  Diagnosis Date  . Bacterial vaginosis   . Bartholin's gland abscess   . Heart murmur   . Hepatitis C     Patient Active Problem List   Diagnosis Date Noted  . Supervision of high risk pregnancy, antepartum 11/02/2018  . Alcohol abuse complicating pregnancy 11/02/2018  . Smoking (tobacco) complicating pregnancy, unspecified trimester 11/02/2018  . Hepatitis C, chronic (HCC) 01/14/2018    Past Surgical History:  Procedure Laterality Date  .  Barholins gland abscess drainage      Prior to Admission medications   Medication Sig Start Date End Date Taking? Authorizing Provider  amoxicillin-clavulanate (AUGMENTIN) 875-125 MG tablet Take 1 tablet by mouth 2  (two) times daily. 11/12/18   Anquan Azzarello, Delorise Royals, PA-C  buprenorphine (SUBUTEX) 8 MG SUBL SL tablet Place 4 mg under the tongue 2 (two) times daily.     [provider]  fluconazole (DIFLUCAN) 150 MG tablet Take 1 tablet (150 mg total) by mouth once for 1 dose. Take after finishing the antibiotics if you begin to experience yeast infection symptoms 11/12/18 11/12/18  Abia Monaco, Delorise Royals, PA-C  metroNIDAZOLE (FLAGYL) 500 MG tablet Take 1 tablet (500 mg total) by mouth 2 (two) times daily. 11/12/18   Charie Pinkus, Delorise Royals, PA-C  Prenat-Fe Poly-Methfol-FA-DHA (VITAFOL FE+) 90-0.6-0.4-200 MG CAPS Take 1 tablet by mouth daily. 11/02/18   Natale Milch, MD    Allergies Motrin [ibuprofen]; Aspirin; Benzalkonium chloride; and Neosporin [neomycin-bacitracin zn-polymyx]  Family History  Problem Relation Age of Onset  . Ovarian cancer Mother   . Hypertension Maternal Grandmother   . Hypertension Maternal Grandfather   . Heart disease Maternal Grandfather   . Lung cancer Paternal Grandmother     Social History Social History   Tobacco Use  . Smoking status: Current Every Day Smoker    Packs/day: 1.00    Types: Cigarettes  . Smokeless tobacco: Never Used  Substance Use Topics  . Alcohol use: No    Comment: Subutex current, Hx of Cannabis and Opioid   . Drug use: Yes    Types: Other-see comments     Review of Systems  Constitutional: No fever/chills Eyes: No visual changes. No discharge ENT: Positive for nasal congestion and sinus pressure Cardiovascular: no chest  pain. Respiratory: Positive cough. No SOB. Gastrointestinal: Left flank/abdomen pain.  No nausea, no vomiting.  No diarrhea.  No constipation. Genitourinary: Positive for dysuria. No hematuria Musculoskeletal: Negative for musculoskeletal pain. Skin: Negative for rash, abrasions, lacerations, ecchymosis. Neurological: Negative for headaches, focal weakness or numbness. 10-point ROS otherwise  negative.  ____________________________________________   PHYSICAL EXAM:  VITAL SIGNS: ED Triage Vitals  Enc Vitals Group     BP 11/12/18 1519 140/83     Pulse Rate 11/12/18 1519 (!) 104     Resp 11/12/18 1519 20     Temp 11/12/18 1519 98 F (36.7 C)     Temp Source 11/12/18 1519 Oral     SpO2 11/12/18 1519 98 %     Weight 11/12/18 1520 128 lb (58.1 kg)     Height 11/12/18 1520 5' (1.524 m)     Head Circumference --      Peak Flow --      Pain Score 11/12/18 1519 6     Pain Loc --      Pain Edu? --      Excl. in GC? --      Constitutional: Alert and oriented. Well appearing and in no acute distress. Eyes: Conjunctivae are normal. PERRL. EOMI. Head: Atraumatic. ENT:      Ears: EACs and TMs unremarkable bilaterally.      Nose: Moderate congestion/rhinnorhea.  Patient is tender to percussion over the maxillary sinuses.      Mouth/Throat: Mucous membranes are moist.  Oropharynx is nonerythematous and nonedematous.  Uvula is midline. Neck: No stridor.  Hematological/Lymphatic/Immunilogical: No cervical lymphadenopathy. Cardiovascular: Normal rate, regular rhythm. Normal S1 and S2.  Good peripheral circulation. Respiratory: Normal respiratory effort without tachypnea or retractions. Lungs CTAB. Good air entry to the bases with no decreased or absent breath sounds. Gastrointestinal: Gravid abdomen.  Palpable fundus below the height of the umbilicus.  Bowel sounds 4 quadrants.  Soft to palpation all quadrants.  Mild tenderness to palpation over the left upper quadrant the most lateral distribution.  No other tenderness to palpation.  No guarding or rigidity. No palpable masses. No distention. No CVA tenderness. Genitourinary: Visualization of the external genitalia reveals no lesions or chancres.  Pelvic exam with speculum reveals no vaginal lacerations or abrasions.  Cervix is visualized and is closed at this time.  No other abnormal findings of the cervix.  Bimanual exam reveals no  cervical motion tenderness concerning for PID.  Other than gravid uterus, no other palpable findings in the adnexa. Musculoskeletal: Full range of motion to all extremities. No gross deformities appreciated. Neurologic:  Normal speech and language. No gross focal neurologic deficits are appreciated.  Skin:  Skin is warm, dry and intact. No rash noted, no hives or other skin lesions present at this time. Psychiatric: Mood and affect are normal. Speech and behavior are normal. Patient exhibits appropriate insight and judgement.  Pelvic exam is performed with female chaperone ____________________________________________   LABS (all labs ordered are listed, but only abnormal results are displayed)  Labs Reviewed  WET PREP, GENITAL - Abnormal; Notable for the following components:      Result Value   Clue Cells Wet Prep HPF POC PRESENT (*)    WBC, Wet Prep HPF POC FEW (*)    All other components within normal limits  URINALYSIS, COMPLETE (UACMP) WITH MICROSCOPIC - Abnormal; Notable for the following components:   Color, Urine YELLOW (*)    APPearance HAZY (*)    All other components within  normal limits  CHLAMYDIA/NGC RT PCR ([facility] ONLY)  URINE CULTURE   ____________________________________________  EKG   ____________________________________________  RADIOLOGY   No results found.  ____________________________________________    PROCEDURES  Procedure(s) performed:    Procedures    Medications - No data to display   ____________________________________________   INITIAL IMPRESSION / ASSESSMENT AND PLAN / ED COURSE  Pertinent labs & imaging results that were available during my care of the patient were reviewed by me and considered in my medical decision making (see chart for details).  Review of the [place] CSRS was performed in accordance of the [facility] prior to dispensing any controlled drugs.  Clinical Course as of Nov 12 1925  Sat Nov 12, 2018  1734  Patient presented to the emergency department with ongoing URI symptoms as well as a complaint of mild dysuria, polyuria, vaginal discharge.  Patient did endorse some mild left flank/left upper quadrant pain.  On exam, patient does have some findings consistent with mild sinusitis.  Lung sounds are clear at this time with no wheezing, rales or rhonchi concerning for bronchitis or pneumonia.  Patient does have mild tenderness to palpation over the left upper quadrant.  No CVA tenderness on exam.  Given urinary symptoms, vaginal discharge, patient will be evaluated with urinalysis and wet prep.  Patient denies any suprapubic or pelvic pain.  She is pregnant at this time, G5, P1.  At this time, there is no strong indication of pyelonephritis with no CVA tenderness, fevers.  Patient has had increased urination since time of pregnancy but does have a slight "tingling" with urination presently.  Patient also has vaginal discharge but no bleeding.  No pelvic complaints.  This time no indication for ultrasound, labs only.   [JC]    Clinical Course User Index [JC] Sharnika Binney, Delorise Royals, PA-C     Patient's diagnosis is consistent with bacterial vaginosis and bacterial sinusitis.  Patient is also pregnant.  Patient presents emergency department with ongoing URI symptoms.  Patient has been placed on Omnicef for likely community-acquired pneumonia.  Exam reveals no adventitious lung sounds and findings are most consistent with bacterial sinusitis.  Patient will be switched to Augmentin to cover bacterial sinusitis.  Patient had some mild left upper quadrant pain, vaginal discharge.  Pelvic exam is reassuring with no indication of PID.  Urinalysis reveals no signs of urinary tract infection.  No indication of pyelonephritis on exam.  Patient does have findings consistent with bacterial vaginosis on wet prep.  Patient will be treated with Flagyl for BV.  Patient is given a prescription for Diflucan to be taken if she  experiences yeast infection symptoms after antibiotic use.  Patient is encouraged to use probiotic to prevent GI and GU side effects of antibiotics.  Follow-up with OB/GYN or primary care as needed..  Patient is given ED precautions to return to the ED for any worsening or new symptoms.     ____________________________________________  FINAL CLINICAL IMPRESSION(S) / ED DIAGNOSES  Final diagnoses:  Bacterial sinusitis  BV (bacterial vaginosis)  [redacted] weeks gestation of pregnancy      NEW MEDICATIONS STARTED DURING THIS VISIT:  ED Discharge Orders         Ordered    amoxicillin-clavulanate (AUGMENTIN) 875-125 MG tablet  2 times daily     11/12/18 1924    metroNIDAZOLE (FLAGYL) 500 MG tablet  2 times daily     11/12/18 1924    fluconazole (DIFLUCAN) 150 MG tablet   Once  11/12/18 1924              This chart was dictated using voice recognition software/Dragon. Despite best efforts to proofread, errors can occur which can change the meaning. Any change was purely unintentional.    Racheal Patches, PA-C 11/12/18 Guerry Bruin, MD 11/18/18 [phone]

## 2018-11-14 ENCOUNTER — Telehealth: Payer: Self-pay | Admitting: Obstetrics and Gynecology

## 2018-11-14 LAB — URINE CULTURE
Culture: NO GROWTH
Special Requests: NORMAL

## 2018-11-14 NOTE — Telephone Encounter (Signed)
So we don't treat pink eye and the urgent care should have treated her if they though she had bacterial pink eye

## 2018-11-14 NOTE — Telephone Encounter (Signed)
advise

## 2018-11-14 NOTE — Telephone Encounter (Signed)
Patient is calling to speak with Nurse about urgent care follow up and ER follow up. Patient currently has pink eye and is requesting medications sent in. Please advise

## 2018-11-15 NOTE — Telephone Encounter (Signed)
Pt aware and states she has an appointment with her PCP tomorrow.

## 2018-11-22 ENCOUNTER — Ambulatory Visit (INDEPENDENT_AMBULATORY_CARE_PROVIDER_SITE_OTHER): Payer: Medicaid Other | Admitting: Obstetrics and Gynecology

## 2018-11-22 VITALS — BP 130/82 | Wt 124.0 lb

## 2018-11-22 DIAGNOSIS — Z363 Encounter for antenatal screening for malformations: Secondary | ICD-10-CM

## 2018-11-22 DIAGNOSIS — Z3A15 15 weeks gestation of pregnancy: Secondary | ICD-10-CM

## 2018-11-22 DIAGNOSIS — O0990 Supervision of high risk pregnancy, unspecified, unspecified trimester: Secondary | ICD-10-CM

## 2018-11-22 DIAGNOSIS — O9989 Other specified diseases and conditions complicating pregnancy, childbirth and the puerperium: Secondary | ICD-10-CM

## 2018-11-22 DIAGNOSIS — B182 Chronic viral hepatitis C: Secondary | ICD-10-CM

## 2018-11-22 LAB — POCT URINALYSIS DIPSTICK OB
GLUCOSE, UA: NEGATIVE
POC,PROTEIN,UA: NEGATIVE

## 2018-11-22 MED ORDER — OMEPRAZOLE 20 MG PO CPDR: 20.0000 mg | DELAYED_RELEASE_CAPSULE | Freq: Every day | ORAL | 11 refills | Status: DC

## 2018-11-22 NOTE — Progress Notes (Signed)
Routine Prenatal Care Visit  Subjective  Laurie Oliver is a 33 y.o. G5P1021 at [redacted]w[redacted]d being seen today for ongoing prenatal care.  She is currently monitored for the following issues for this high-risk pregnancy and has Hepatitis C, chronic (HCC); Supervision of high risk pregnancy, antepartum; Alcohol abuse complicating pregnancy; and Smoking (tobacco) complicating pregnancy, unspecified trimester on their problem list.  ----------------------------------------------------------------------------------- Patient reports no complaints.  Reports pruritic facial rash Contractions: Not present. Vag. Bleeding: None.  Movement: Absent. Denies leaking of fluid.  ----------------------------------------------------------------------------------- The following portions of the patient's history were reviewed and updated as appropriate: allergies, current medications, past family history, past medical history, past social history, past surgical history and problem list. Problem list updated.   Objective  Blood pressure 130/82, weight 124 lb (56.2 kg), last menstrual period 09/09/2018, unknown if currently breastfeeding. Pregravid weight 119 lb (54 kg) Total Weight Gain 5 lb (2.268 kg) Urinalysis:      Fetal Status: Fetal Heart Rate (bpm): 150   Movement: Absent     General:  Alert, oriented and cooperative. Patient is in no acute distress.  Skin: Skin is warm and dry. No rash noted.   Cardiovascular: Normal heart rate noted  Respiratory: Normal respiratory effort, no problems with respiration noted  Abdomen: Soft, gravid, appropriate for gestational age. Pain/Pressure: Absent     Pelvic:  Cervical exam deferred        Extremities: Normal range of motion.     ental Status: Normal mood and affect. Normal behavior. Normal judgment and thought content.   There is no immunization history for the selected administration types on file for this patient.   Assessment   33 y.o. Q3E0923 at [redacted]w[redacted]d by   05/11/2019, Alternate EDD Entry presenting for routine prenatal visit  Plan   Pregnancy #3 Problems (from 09/09/18 to present)    Problem Noted Resolved   Supervision of high risk pregnancy, antepartum 11/02/2018 by Natale Milch, MD No   Overview Addendum 11/09/2018  8:04 AM by Vena Austria, MD      Clinic [facility] Prenatal Labs  Dating  12 wk [place] Blood type:   O pos  Genetic Screen AFP:  Declined 11/22/2018    NIPS: normal XX   Antibody: negative  Anatomic [place]  Rubella: Immune  Varicella: Immune  GTT    RPR:  NR  Rhogam  HBsAg:   negative  TDaP vaccine                       HIV:   negative  Flu Shot   Declines                             GBS:   Contraception  Pap: 11/02/2018 NIL HPV negative  CBB     CS/VBAC    Baby Food    Support Person             Alcohol abuse complicating pregnancy 11/02/2018 by Natale Milch, MD No   Smoking (tobacco) complicating pregnancy, unspecified trimester 11/02/2018 by Natale Milch, MD No       Gestational age appropriate obstetric precautions including but not limited to vaginal bleeding, contractions, leaking of fluid and fetal movement were reviewed in detail with the patient.    - Rash hard to appreciate today secondary to patient wearing make up.  Discussed mild hydrocortisone may be reasonable avoid wearing make up until resolution  Return in about 4 weeks (around 12/20/2018) for anatomy scan [facility], ROB.   Vena Austria, MD, [facility], [facility]

## 2018-11-22 NOTE — Progress Notes (Signed)
ROB Itchy rash over body

## 2018-12-12 ENCOUNTER — Telehealth: Payer: Self-pay
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
## 2018-12-16 ENCOUNTER — Telehealth: Payer: Self-pay

## 2018-12-16 NOTE — Telephone Encounter (Signed)
Per AMS Patient need to contact [facility] due to chain of custody orders. Patient advise

## 2018-12-16 NOTE — Telephone Encounter (Signed)
Pt called after hour nurse stating she has an appt coming uip next week and she is wanting to know how to get a DNA test for her hsb for the baby.  [phone] LMTC.  Test is done after delivery and is very expensive.

## 2018-12-22 ENCOUNTER — Ambulatory Visit (INDEPENDENT_AMBULATORY_CARE_PROVIDER_SITE_OTHER): Payer: Medicaid Other | Admitting: Obstetrics and Gynecology

## 2018-12-22 ENCOUNTER — Other Ambulatory Visit: Payer: Self-pay

## 2018-12-22 ENCOUNTER — Encounter: Payer: Self-pay | Admitting: Obstetrics and Gynecology

## 2018-12-22 ENCOUNTER — Ambulatory Visit (INDEPENDENT_AMBULATORY_CARE_PROVIDER_SITE_OTHER): Payer: Medicaid Other

## 2018-12-22 VITALS — BP 124/78 | Wt 132.0 lb

## 2018-12-22 DIAGNOSIS — Z363 Encounter for antenatal screening for malformations: Secondary | ICD-10-CM

## 2018-12-22 DIAGNOSIS — O0990 Supervision of high risk pregnancy, unspecified, unspecified trimester: Secondary | ICD-10-CM

## 2018-12-22 DIAGNOSIS — B182 Chronic viral hepatitis C: Secondary | ICD-10-CM

## 2018-12-22 DIAGNOSIS — O99330 Smoking (tobacco) complicating pregnancy, unspecified trimester: Secondary | ICD-10-CM

## 2018-12-22 DIAGNOSIS — F1010 Alcohol abuse, uncomplicated: Secondary | ICD-10-CM

## 2018-12-22 DIAGNOSIS — O99332 Smoking (tobacco) complicating pregnancy, second trimester: Secondary | ICD-10-CM

## 2018-12-22 DIAGNOSIS — Z3A20 20 weeks gestation of pregnancy: Secondary | ICD-10-CM

## 2018-12-22 DIAGNOSIS — O99312 Alcohol use complicating pregnancy, second trimester: Secondary | ICD-10-CM

## 2018-12-22 DIAGNOSIS — O99310 Alcohol use complicating pregnancy, unspecified trimester: Secondary | ICD-10-CM

## 2018-12-22 NOTE — Progress Notes (Signed)
Routine Prenatal Care Visit  Subjective  Laurie Oliver is a 33 y.o. G5P1021 at [redacted]w[redacted]d being seen today for ongoing prenatal care.  She is currently monitored for the following issues for this high-risk pregnancy and has Hepatitis C, chronic (HCC); Supervision of high risk pregnancy, antepartum; Alcohol abuse complicating pregnancy; and Smoking (tobacco) complicating pregnancy, unspecified trimester on their problem list.  ----------------------------------------------------------------------------------- Patient reports no complaints.   Contractions: Not present. Vag. Bleeding: None.  Movement: Present. Denies leaking of fluid.  EDD adjusted after reviewing dating criteria.  No notation to explain 8/20 EDD versus u/s on 2/12 that showed EDD 8/24. So, EDD changed. ANatomy scan incomplete. But, normal for what was visualized. ----------------------------------------------------------------------------------- The following portions of the patient's history were reviewed and updated as appropriate: allergies, current medications, past family history, past medical history, past social history, past surgical history and problem list. Problem list updated.   Objective  Blood pressure 124/78, weight 132 lb (59.9 kg), last menstrual period 09/09/2018, unknown if currently breastfeeding. Pregravid weight 119 lb (54 kg) Total Weight Gain 13 lb (5.897 kg) Urinalysis: Urine Protein    Urine Glucose    Fetal Status: Fetal Heart Rate (bpm): 147   Movement: Present     General:  Alert, oriented and cooperative. Patient is in no acute distress.  Skin: Skin is warm and dry. No rash noted.   Cardiovascular: Normal heart rate noted  Respiratory: Normal respiratory effort, no problems with respiration noted  Abdomen: Soft, gravid, appropriate for gestational age. Pain/Pressure: Absent     Pelvic:  Cervical exam deferred        Extremities: Normal range of motion.     Mental Status: Normal mood and affect.  Normal behavior. Normal judgment and thought content.   Imaging Results US Ob Comp + 14 Wk  Result Date: 12/22/2018 ULTRASOUND REPORT Location: [facility] Date of Service: 12/22/2018 Patient Name: Laurie Oliver DOB: [birth year] MRN: [id number] Indications:Anatomy Ultrasound Findings: Mason Jim intrauterine pregnancy is visualized with FHR at 147 BPM. Biometrics give an (U/S) Gestational age of [redacted]w[redacted]d and an (U/S) EDD of 05/18/2019; this correlates with the clinically established Estimated Date of Delivery: 05/11/2019.  Fetal presentation is Cephalic. EFW: 278 gram(10 oz). Placenta: posterior. Grade: 0 AFI: subjectively normal. Anatomic survey is incomplete for PCI, face, diaphragm and spine due to fetal position and normal; Gender - female.  Right Ovary is normal in appearance. Left Ovary is normal appearance. Survey of the adnexa demonstrates no adnexal masses. There is no free peritoneal fluid in the cul de sac. Impression: 1. [redacted]w[redacted]d Viable Singleton Intrauterine pregnancy by U/S. 2. (U/S) EDD is consistent with Clinically established Estimated Date of Delivery: 05/11/19 . 3. Normal Anatomy Scan Recommendations: 1.Clinical correlation with the patient's History and Physical Exam. 2. Follow up anatomy ultrasound Mital bahen P Patel, RDMS There is a singleton gestation with subjectively normal amniotic fluid volume. The fetal biometry correlates with established dating. Detailed evaluation of the fetal anatomy was performed.The fetal anatomical survey appears within normal limits within the resolution of ultrasound as described above.  Not all structures were able to be visualized on today's study.  It is recommended that a follow-up ultrasound be performed to complete visualization of the unobserved structures today.  It must be noted that a normal ultrasound is unable to rule out fetal aneuploidy nor is it able to detect all possible malformations.    The ultrasound images and findings were reviewed by me and I  agree with the above report.  Thomasene Mohair, MD, [facility], [facility] 12/22/2018 9:15 AM       Assessment   32 y.o. Y6E1583 at [redacted]w[redacted]d by  05/15/2019, by Ultrasound presenting for routine prenatal visit  Plan   Pregnancy #3 Problems (from 09/09/18 to present)    Problem Noted Resolved   Supervision of high risk pregnancy, antepartum 11/02/2018 by Natale Milch, MD No   Overview Addendum 11/09/2018  8:04 AM by Vena Austria, MD      Clinic [facility] Prenatal Labs  Dating  12 wk [place] Blood type:     Genetic Screen AFP:        NIPS: normal XX   Antibody:   Anatomic [place]  Rubella:   Varicella: @VZVIGG @  GTT Early:        28 wk:      RPR:     Rhogam  HBsAg:     TDaP vaccine                       HIV:     Flu Shot   Declines                             GBS:   Contraception  Pap:  CBB     CS/VBAC    Baby Food    Support Person            Alcohol abuse complicating pregnancy 11/02/2018 by Natale Milch, MD No   Smoking (tobacco) complicating pregnancy, unspecified trimester 11/02/2018 by Natale Milch, MD No       Preterm labor symptoms and general obstetric precautions including but not limited to vaginal bleeding, contractions, leaking of fluid and fetal movement were reviewed in detail with the patient. Please refer to After Visit Summary for other counseling recommendations.   Return in about 2 weeks (around 01/05/2019) for U/S for anatomy completion with routine prenatal after (2-4 weeks).  Thomasene Mohair, MD, [facility], [facility] 12/22/2018 9:40 AM

## 2019-01-04 ENCOUNTER — Telehealth: Payer: Self-pay

## 2019-01-04 NOTE — Telephone Encounter (Signed)
Pt works at [employer] and needs a letter for work stating she is high risk pregnancy.

## 2019-01-04 NOTE — Telephone Encounter (Signed)
Can you manage this?  Needs a letter stating she is a high risk pregnancy. Thank  you!

## 2019-01-04 NOTE — Telephone Encounter (Signed)
Pt aware.

## 2019-01-04 NOTE — Telephone Encounter (Signed)
Letter has been written. Can you let pt know please? I will leave it up front for her

## 2019-01-05 ENCOUNTER — Encounter: Payer: Medicaid Other | Admitting: Certified Nurse Midwife

## 2019-01-05 ENCOUNTER — Ambulatory Visit: Payer: Medicaid Other

## 2019-01-11 ENCOUNTER — Other Ambulatory Visit: Payer: Self-pay

## 2019-01-11 ENCOUNTER — Ambulatory Visit (INDEPENDENT_AMBULATORY_CARE_PROVIDER_SITE_OTHER): Payer: Medicaid Other | Admitting: Obstetrics and Gynecology

## 2019-01-11 ENCOUNTER — Ambulatory Visit (INDEPENDENT_AMBULATORY_CARE_PROVIDER_SITE_OTHER): Payer: Medicaid Other

## 2019-01-11 VITALS — BP 104/62 | Wt 136.0 lb

## 2019-01-11 DIAGNOSIS — O99332 Smoking (tobacco) complicating pregnancy, second trimester: Secondary | ICD-10-CM

## 2019-01-11 DIAGNOSIS — O0990 Supervision of high risk pregnancy, unspecified, unspecified trimester: Secondary | ICD-10-CM

## 2019-01-11 DIAGNOSIS — Z362 Encounter for other antenatal screening follow-up: Secondary | ICD-10-CM | POA: Diagnosis not present

## 2019-01-11 DIAGNOSIS — B182 Chronic viral hepatitis C: Secondary | ICD-10-CM

## 2019-01-11 DIAGNOSIS — O9989 Other specified diseases and conditions complicating pregnancy, childbirth and the puerperium: Secondary | ICD-10-CM

## 2019-01-11 DIAGNOSIS — Z3A22 22 weeks gestation of pregnancy: Secondary | ICD-10-CM

## 2019-01-11 DIAGNOSIS — O99330 Smoking (tobacco) complicating pregnancy, unspecified trimester: Secondary | ICD-10-CM

## 2019-01-11 LAB — POCT URINALYSIS DIPSTICK OB
Glucose, UA: NEGATIVE
POC,PROTEIN,UA: NEGATIVE

## 2019-01-11 NOTE — Progress Notes (Signed)
ROB Anatomy scan today It is a GIRL 

## 2019-01-11 NOTE — Progress Notes (Signed)
Routine Prenatal Care Visit  Subjective  Laurie Oliver is a 33 y.o. G5P1021 at 6953w2d being seen today for ongoing prenatal care.  She is currently monitored for the following issues for this high-risk pregnancy and has Hepatitis C, chronic (HCC); Supervision of high risk pregnancy, antepartum; Alcohol abuse complicating pregnancy; and Smoking (tobacco) complicating pregnancy, unspecified trimester on their problem list.  ----------------------------------------------------------------------------------- Patient reports no complaints.   Contractions: Not present. Vag. Bleeding: None.  Movement: Present. Denies leaking of fluid.  ----------------------------------------------------------------------------------- The following portions of the patient's history were reviewed and updated as appropriate: allergies, current medications, past family history, past medical history, past social history, past surgical history and problem list. Problem list updated.   Objective  Last menstrual period 09/09/2018, unknown if currently breastfeeding. Pregravid weight 119 lb (54 kg) Total Weight Gain 17 lb (7.711 kg) Urinalysis:      Fetal Status: Fetal Heart Rate (bpm): 145   Movement: Present     General:  Alert, oriented and cooperative. Patient is in no acute distress.  Skin: Skin is warm and dry. No rash noted.   Cardiovascular: Normal heart rate noted  Respiratory: Normal respiratory effort, no problems with respiration noted  Abdomen: Soft, gravid, appropriate for gestational age. Pain/Pressure: Absent     Pelvic:  Cervical exam deferred        Extremities: Normal range of motion.     ental Status: Normal mood and affect. Normal behavior. Normal judgment and thought content.   [place] Ob Comp + 14 Wk  Result Date: 12/22/2018 ULTRASOUND REPORT Location: [facility] Date of Service: 12/22/2018 Patient Name: Laurie IvanoffShannon N Ullman DOB: 09/10/1986 MRN: [id number] Indications:Anatomy Ultrasound Findings:  Mason JimSingleton intrauterine pregnancy is visualized with FHR at 147 BPM. Biometrics give an (U/S) Gestational age of 3965w0d and an (U/S) EDD of 05/18/2019; this correlates with the clinically established Estimated Date of Delivery: 05/11/2019.  Fetal presentation is Cephalic. EFW: 278 gram(10 oz). Placenta: posterior. Grade: 0 AFI: subjectively normal. Anatomic survey is incomplete for PCI, face, diaphragm and spine due to fetal position and normal; Gender - female.  Right Ovary is normal in appearance. Left Ovary is normal appearance. Survey of the adnexa demonstrates no adnexal masses. There is no free peritoneal fluid in the cul de sac. Impression: 1. 2238w0d Viable Singleton Intrauterine pregnancy by U/S. 2. (U/S) EDD is consistent with Clinically established Estimated Date of Delivery: 05/11/19 . 3. Normal Anatomy Scan Recommendations: 1.Clinical correlation with the patient's History and Physical Exam. 2. Follow up anatomy ultrasound Mital bahen P Patel, RDMS There is a singleton gestation with subjectively normal amniotic fluid volume. The fetal biometry correlates with established dating. Detailed evaluation of the fetal anatomy was performed.The fetal anatomical survey appears within normal limits within the resolution of ultrasound as described above.  Not all structures were able to be visualized on today's study.  It is recommended that a follow-up ultrasound be performed to complete visualization of the unobserved structures today.  It must be noted that a normal ultrasound is unable to rule out fetal aneuploidy nor is it able to detect all possible malformations.    The ultrasound images and findings were reviewed by me and I agree with the above report. Thomasene MohairStephen Jackson, MD, Merlinda FrederickFACOG [facility], [facility] 12/22/2018 9:15 AM     [place] Ob Follow Up  Result Date: 01/11/2019 Patient Name: Laurie IvanoffShannon N Rabinovich DOB: 01/03/1986 MRN: [id number] ULTRASOUND REPORT Location: [facility] Date of Service:  01/11/2019 Indications: Anatomy follow  up ultrasound Findings: Singleton intrauterine pregnancy is visualized with FHR at 143 BPM. Fetal presentation is Breech. Placenta: posterior. Grade: 1 AFI: subjectively normal. Anatomic survey is complete. There is no free peritoneal fluid in the cul de sac. Impression: 1. [redacted]w[redacted]d Viable Singleton Intrauterine pregnancy previously established criteria. 2. Normal Anatomy Scan is now complete Recommendations: 1.Clinical correlation with the patient's History and Physical Exam. 2. Anatomic survey is now complete for PIC,Face,diaphram and spine. Clydene Laming, RDMS There is a singleton gestation with subjectively normal amniotic fluid volume.  Limited evaluation of the fetal anatomy was performed today, focusing on on anatomic structures not fully visualized at the time of prior study.The visualized fetal anatomical survey appears within normal limits within the resolution of ultrasound as described above, and the anatomic survey is now complete.  It must be noted that a normal ultrasound is unable to rule out fetal aneuploidy.  Vena Austria, MD, Evern Core [facility], [facility] 01/11/2019, 1:34 PM     Assessment   33 y.o. O6Z1245 at [redacted]w[redacted]d by  05/15/2019, by Ultrasound presenting for routine prenatal visit  Plan   Pregnancy #3 Problems (from 09/09/18 to present)    Problem Noted Resolved   Supervision of high risk pregnancy, antepartum 11/02/2018 by Natale Milch, MD No   Overview Addendum 11/09/2018  8:04 AM by Vena Austria, MD      Clinic [facility] Prenatal Labs  Dating  12 wk [place] Blood type:   O pos  Genetic Screen NIPS: normal XX   Antibody: Negative  Anatomic [place] Complete Rubella: Immune  Varicella: Immune  GTT  28 wk:      RPR: NR  Rhogam  HBsAg:  Negative  TDaP vaccine                       HIV:  Negative  Flu Shot   Declines                             GBS:   Contraception  Pap: 11/02/2018 NIL  CBB     CS/VBAC    Baby Food     Support Person             Alcohol abuse complicating pregnancy 11/02/2018 by Natale Milch, MD No   Smoking (tobacco) complicating pregnancy, unspecified trimester 11/02/2018 by Natale Milch, MD No       Gestational age appropriate obstetric precautions including but not limited to vaginal bleeding, contractions, leaking of fluid and fetal movement were reviewed in detail with the patient.    - anatomy scan complete  Return in about 6 weeks (around 02/22/2019) for ROB and 28 week labs.  Vena Austria, MD, [facility], [facility] 01/11/2019, 1:53 PM

## 2019-01-16 ENCOUNTER — Encounter: Payer: Self-pay | Admitting: Obstetrics and Gynecology

## 2019-01-16 ENCOUNTER — Telehealth: Payer: Self-pay

## 2019-01-16 NOTE — Telephone Encounter (Signed)
Pt called triage stating she is a AMS pt and needed a note stating exactly that "she is to be released from working in the [employer] at [employer] due to her pregnancy being high risk" I have been advised by AMS that he cannot write a note stating she has to be out of work because pregnant women are at higher risk of Covid-19. I have advised the pt and she is notably upset she stated "he told me not to go to work and now he wont write me a note stating that he will release me from work, I will change my OB if I have to just to get a note, I am not putting this baby at risk I already lost a baby before this pregnancy" I apologized to the pt and told her I would send this message to AMS for him to review. Thank you

## 2019-01-16 NOTE — Progress Notes (Signed)
24 G5P1021 at [redacted]w[redacted]d with worsening lumbago of pregnancy, no dysuria or flank pain.  Discussed conservative measures including Tylenol, localized heat, topical lineament creams, and pregnancy support belts.  Would recommend against trial of flexeril given substance use history.  Avoiding lifting if possible.  To re-evaluate in one week to see if improvement with conservative measures.

## 2019-01-18 ENCOUNTER — Telehealth: Payer: Self-pay

## 2019-01-18 NOTE — Telephone Encounter (Signed)
Pt states AMS advised her to call back if she was having more severe back pain than usual. [phone]

## 2019-01-18 NOTE — Telephone Encounter (Signed)
We discussed so keep conservative measures going at present set up telephone visit sometime early to mid next week

## 2019-01-20 NOTE — Telephone Encounter (Signed)
Patient is schedule 01/25/19 with AMS for telephone visit

## 2019-01-25 ENCOUNTER — Other Ambulatory Visit: Payer: Self-pay

## 2019-01-25 ENCOUNTER — Ambulatory Visit (INDEPENDENT_AMBULATORY_CARE_PROVIDER_SITE_OTHER): Payer: Medicaid Other | Admitting: Obstetrics and Gynecology

## 2019-01-25 DIAGNOSIS — B182 Chronic viral hepatitis C: Secondary | ICD-10-CM | POA: Diagnosis not present

## 2019-01-25 DIAGNOSIS — O99891 Other specified diseases and conditions complicating pregnancy: Secondary | ICD-10-CM

## 2019-01-25 DIAGNOSIS — O0990 Supervision of high risk pregnancy, unspecified, unspecified trimester: Secondary | ICD-10-CM

## 2019-01-25 DIAGNOSIS — Z3A24 24 weeks gestation of pregnancy: Secondary | ICD-10-CM | POA: Diagnosis not present

## 2019-01-25 DIAGNOSIS — M549 Dorsalgia, unspecified: Secondary | ICD-10-CM | POA: Diagnosis not present

## 2019-01-25 DIAGNOSIS — O9989 Other specified diseases and conditions complicating pregnancy, childbirth and the puerperium: Secondary | ICD-10-CM | POA: Diagnosis not present

## 2019-01-25 NOTE — Progress Notes (Signed)
ROB- Back pain.  

## 2019-01-25 NOTE — Progress Notes (Signed)
I connected with Laurie IvanoffShannon N Morejon on 01/25/19 at  1:30 PM EDT by telephone and verified that I am speaking with the correct person using two identifiers.   I discussed the limitations, risks, security and privacy concerns of performing an evaluation and management service by telephone and the availability of in person appointments. I also discussed with the patient that there may be a patient responsible charge related to this service. The patient expressed understanding and agreed to proceed.  The patient was at home I spoke with the patient from my workstation phoneThe names of people involved in this encounter were: Laurie Oliver , and Vena AustriaAndreas Daniqua Campoy   Routine Prenatal Care Visit  Subjective  Laurie Oliver is a 33 y.o. [phone] at 7858w2d being seen today for ongoing prenatal care.  She is currently monitored for the following issues for this high-risk pregnancy and has Hepatitis C, chronic (HCC); Supervision of high risk pregnancy, antepartum; Alcohol abuse complicating pregnancy; and Smoking (tobacco) complicating pregnancy, unspecified trimester on their problem list.  ----------------------------------------------------------------------------------- Patient reports backache. The patient has reported worsening lumbar and sacral back pain this pregnancy.  She has tried conservative measures including localized heat, rest, topical lineament creams without improvement in symptoms.  She has been out of work but is worried that working will further exacerbate her lumbago secondary to lifting requirements.  No dysuria or flank pain.      Contractions: Not present. Vag. Bleeding: None.  Movement: Present. Denies leaking of fluid.  ----------------------------------------------------------------------------------- The following portions of the patient's history were reviewed and updated as appropriate: allergies, current medications, past family history, past medical history, past social history,  past surgical history and problem list. Problem list updated.   Objective  Last menstrual period 09/09/2018, unknown if currently breastfeeding. Pregravid weight 119 lb (54 kg) Total Weight Gain 17 lb (7.711 kg) Urinalysis:      Fetal Status:     Movement: Present     I connected with@  on 01/25/19 at  1:30 PM EDT by telephone and verified that I am speaking with the correct person using two identifiers.   I discussed the limitations, risks, security and privacy concerns of performing an evaluation and management service by telephone and the availability of in person appointments. I also discussed with the patient that there may be a patient responsible charge related to this service. The patient expressed understanding and agreed to proceed.  The patient was at home I spoke with the patient from my workstation phone The names of people involved in this encounter were: Laurie Oliver , and Vena Austriandreas Eddye Broxterman  Assessment   33 y.o. [id number] at 6058w2d by  05/15/2019, by Ultrasound presenting for work-in prenatal visit  Plan   Pregnancy #3 Problems (from 09/09/18 to present)    Problem Noted Resolved   Supervision of high risk pregnancy, antepartum 11/02/2018 by Natale MilchSchuman, Christanna R, MD No   Overview Addendum 01/11/2019  1:38 PM by Vena AustriaStaebler, Jaran Sainz, MD      Clinic [facility] Prenatal Labs  Dating  12 wk US Blood type: O/Positive/-- (02/12 1458)   Genetic Screen NIPS: normal XX   Antibody:Negative (02/12 1458)  Anatomic US Complete Rubella: 1.38 (02/12 1458) Varicella: Immune  GTT      RPR: Non Reactive (02/12 1458)   Rhogam N/A HBsAg: Negative (02/12 1458)   TDaP vaccine                       HIV: Non Reactive (  02/12 1458)   Flu Shot   Declines                             GBS:   Contraception  Pap:  CBB     CS/VBAC    Baby Food    Support Person             Alcohol abuse complicating pregnancy 11/02/2018 by Natale Milch, MD No   Smoking (tobacco) complicating pregnancy,  unspecified trimester 11/02/2018 by Natale Milch, MD No       Gestational age appropriate obstetric precautions including but not limited to vaginal bleeding, contractions, leaking of fluid and fetal movement were reviewed in detail with the patient.    - given continued lumbago out of work for remainder of pregnancy unless improvement  Return in about 3 years (around 01/24/2022) for 3-4 weeks ROB and 28 week labs.  Vena Austria, MD, Evern [facility], [facility] 01/25/2019, 1:36 PM

## 2019-02-08 ENCOUNTER — Telehealth: Payer: Self-pay

## 2019-02-08 NOTE — Telephone Encounter (Signed)
FMLA/DISABILITY form for [employer] filled out, signature obtained and given to KT for processing. 

## 2019-02-22 ENCOUNTER — Other Ambulatory Visit: Payer: Medicaid Other

## 2019-02-22 ENCOUNTER — Ambulatory Visit (INDEPENDENT_AMBULATORY_CARE_PROVIDER_SITE_OTHER): Payer: Medicaid Other | Admitting: Obstetrics and Gynecology

## 2019-02-22 ENCOUNTER — Telehealth: Payer: Self-pay | Admitting: Obstetrics and Gynecology

## 2019-02-22 ENCOUNTER — Encounter: Payer: Self-pay | Admitting: Obstetrics and Gynecology

## 2019-02-22 ENCOUNTER — Other Ambulatory Visit: Payer: Self-pay

## 2019-02-22 VITALS — BP 114/74 | Wt 138.0 lb

## 2019-02-22 DIAGNOSIS — O9989 Other specified diseases and conditions complicating pregnancy, childbirth and the puerperium: Secondary | ICD-10-CM

## 2019-02-22 DIAGNOSIS — O99333 Smoking (tobacco) complicating pregnancy, third trimester: Secondary | ICD-10-CM

## 2019-02-22 DIAGNOSIS — B182 Chronic viral hepatitis C: Secondary | ICD-10-CM

## 2019-02-22 DIAGNOSIS — F1010 Alcohol abuse, uncomplicated: Secondary | ICD-10-CM

## 2019-02-22 DIAGNOSIS — O0993 Supervision of high risk pregnancy, unspecified, third trimester: Secondary | ICD-10-CM

## 2019-02-22 DIAGNOSIS — O99313 Alcohol use complicating pregnancy, third trimester: Secondary | ICD-10-CM

## 2019-02-22 DIAGNOSIS — M549 Dorsalgia, unspecified: Secondary | ICD-10-CM

## 2019-02-22 DIAGNOSIS — O99330 Smoking (tobacco) complicating pregnancy, unspecified trimester: Secondary | ICD-10-CM

## 2019-02-22 DIAGNOSIS — Z3A28 28 weeks gestation of pregnancy: Secondary | ICD-10-CM

## 2019-02-22 DIAGNOSIS — O0990 Supervision of high risk pregnancy, unspecified, unspecified trimester: Secondary | ICD-10-CM

## 2019-02-22 DIAGNOSIS — O99891 Other specified diseases and conditions complicating pregnancy: Secondary | ICD-10-CM | POA: Insufficient documentation

## 2019-02-22 NOTE — Telephone Encounter (Signed)
Patient saw SDJ today and being referred to PT.  She is asking if she could get anything for the pain she is having, OTC not working.  Pt states she was told to talk to AMS to see if anything could be sent in for her.

## 2019-02-22 NOTE — Progress Notes (Signed)
Routine Prenatal Care Visit  Subjective  Laurie Oliver is a 33 y.o. G5P1021 at [redacted]w[redacted]d being seen today for ongoing prenatal care.  She is currently monitored for the following issues for this high-risk pregnancy and has Hepatitis C, chronic (HCC); Supervision of high risk pregnancy, antepartum; Alcohol abuse complicating pregnancy; Smoking (tobacco) complicating pregnancy, unspecified trimester; and Back pain affecting pregnancy on their problem list.  ----------------------------------------------------------------------------------- Patient reports continued severe backache.  She is unable to work.  The pain is in her mid back along the para spinous muscles.  It extends from her iliac crest all the way to her inferior scapula.     Contractions: Not present. Vag. Bleeding: None.  Movement: Present. Denies leaking of fluid.  ----------------------------------------------------------------------------------- The following portions of the patient's history were reviewed and updated as appropriate: allergies, current medications, past family history, past medical history, past social history, past surgical history and problem list. Problem list updated.   Objective  Blood pressure 114/74, weight 138 lb (62.6 kg), last menstrual period 09/09/2018, unknown if currently breastfeeding. Pregravid weight 119 lb (54 kg) Total Weight Gain 19 lb (8.618 kg) Urinalysis: Urine Protein    Urine Glucose    Fetal Status: Fetal Heart Rate (bpm): 140 Fundal Height: 28 cm Movement: Present     General:  Alert, oriented and cooperative. Patient is in no acute distress.  Skin: Skin is warm and dry. No rash noted.   Cardiovascular: Normal heart rate noted  Respiratory: Normal respiratory effort, no problems with respiration noted  Abdomen: Soft, gravid, appropriate for gestational age. Pain/Pressure: Present   Back is exquisitely tender to palpation along the paraspinous muscles.  No obvious injury or deformity.     Pelvic:  Cervical exam deferred        Extremities: Normal range of motion.     Mental Status: Normal mood and affect. Normal behavior. Normal judgment and thought content.   Assessment   33 y.o. O1I1030 at [redacted]w[redacted]d by  05/15/2019, by Ultrasound presenting for routine prenatal visit  Plan   Pregnancy #3 Problems (from 09/09/18 to present)    Problem Noted Resolved   Back pain affecting pregnancy 02/22/2019 by Conard Novak, MD No   Supervision of high risk pregnancy, antepartum 11/02/2018 by Natale Milch, MD No   Overview Addendum 01/11/2019  1:38 PM by Vena Austria, MD      Clinic [facility] Prenatal Labs  Dating  12 wk [place] Blood type: O/Positive/-- (02/12 1458)   Genetic Screen NIPS: normal XX   Antibody:Negative (02/12 1458)  Anatomic [place] Complete Rubella: 1.38 (02/12 1458) Varicella: Immune  GTT      RPR: Non Reactive (02/12 1458)   Rhogam N/A HBsAg: Negative (02/12 1458)   TDaP vaccine                       HIV: Non Reactive (02/12 1458)   Flu Shot   Declines                             GBS:   Contraception  Pap:  CBB     CS/VBAC    Baby Food    Support Person             Alcohol abuse complicating pregnancy 11/02/2018 by Natale Milch, MD No   Smoking (tobacco) complicating pregnancy, unspecified trimester 11/02/2018 by Natale Milch, MD No       Preterm  labor symptoms and general obstetric precautions including but not limited to vaginal bleeding, contractions, leaking of fluid and fetal movement were reviewed in detail with the patient. Please refer to After Visit Summary for other counseling recommendations.   - PT Referral for back pain.  Review of prescription monitoring database shows no recent narcotics apart from Subutex.   The patient DID ask for a narcotic pain medication. I declined.    Return in about 2 weeks (around 03/08/2019) for Routine Prenatal Appointment/telephone.  Thomasene MohairStephen Kasandra Fehr, MD, Merlinda FrederickFACOG [facility], [facility] 02/22/2019 7:39 PM

## 2019-02-23 LAB — 28 WEEK RH+PANEL
Basophils Absolute: 0 10*3/uL (ref 0.0–0.2)
Basos: 0 %
EOS (ABSOLUTE): 0.1 10*3/uL (ref 0.0–0.4)
Eos: 1 %
Gestational Diabetes Screen: 129 mg/dL (ref 65–139)
HIV Screen 4th Generation wRfx: NONREACTIVE
Hematocrit: 34.3 % (ref 34.0–46.6)
Hemoglobin: 11.8 g/dL (ref 11.1–15.9)
Immature Grans (Abs): 0 10*3/uL (ref 0.0–0.1)
Immature Granulocytes: 0 %
Lymphocytes Absolute: 1.5 10*3/uL (ref 0.7–3.1)
Lymphs: 28 %
MCH: 32.2 pg (ref 26.6–33.0)
MCHC: 34.4 g/dL (ref 31.5–35.7)
MCV: 94 fL (ref 79–97)
Monocytes Absolute: 0.4 10*3/uL (ref 0.1–0.9)
Monocytes: 7 %
Neutrophils Absolute: 3.4 10*3/uL (ref 1.4–7.0)
Neutrophils: 64 %
Platelets: 139 10*3/uL — ABNORMAL LOW (ref 150–450)
RBC: 3.67 x10E6/uL — ABNORMAL LOW (ref 3.77–5.28)
RDW: 12.7 % (ref 11.7–15.4)
RPR Ser Ql: NONREACTIVE
WBC: 5.4 10*3/uL (ref 3.4–10.8)

## 2019-02-23 NOTE — Telephone Encounter (Signed)
no

## 2019-02-23 NOTE — Telephone Encounter (Signed)
Pt aware, via voicemail 

## 2019-02-23 NOTE — Telephone Encounter (Signed)
Please advise 

## 2019-02-27 ENCOUNTER — Ambulatory Visit: Payer: Medicaid Other | Attending: Obstetrics and Gynecology

## 2019-02-27 ENCOUNTER — Other Ambulatory Visit: Payer: Self-pay

## 2019-02-27 DIAGNOSIS — M62838 Other muscle spasm: Secondary | ICD-10-CM | POA: Diagnosis present

## 2019-02-27 DIAGNOSIS — M62830 Muscle spasm of back: Secondary | ICD-10-CM | POA: Insufficient documentation

## 2019-02-27 DIAGNOSIS — R293 Abnormal posture: Secondary | ICD-10-CM | POA: Insufficient documentation

## 2019-02-27 NOTE — Patient Instructions (Addendum)
Getting In/out of bed     Lying on back, bend left knee and place left arm across chest. Roll all in one movement to the right. Reverse to roll to the left. Always move as one unit.     Once you are lying on you side, move legs to edge of bed. Pull in the pelvic floor and lower tummy and push down with both hands while moving legs off bed to reach sitting position.  *Reverse sequence to return to lying down.  Copyright  VHI. All rights reserved.    MET to Correct Right Anteriorly Rotated/Left Posteriorly Rotated Innominate   Begin laying on your back with your feet at 90 degrees. Put a dowel/broomstick  through your legs, behind your right knee and in front of your left knee. Stabilize the dowel on ether side with your hands.  Press down with the right leg and up with the left leg. Hold for 5 seconds  then slowly relax. Repeat 5 times.  Access Code: [email or web address]  URL: [email or web address]  Date: 02/27/2019  Prepared by: Letitia Libra   Exercises  Seated Piriformis Stretch - 10 reps - 3 sets - 1x daily - 7x weekly  Seated Hip Flexor Stretch - 10 reps - 3 sets - 1x daily - 7x weekly
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
## 2019-03-08 ENCOUNTER — Other Ambulatory Visit: Payer: Self-pay

## 2019-03-08 ENCOUNTER — Ambulatory Visit (INDEPENDENT_AMBULATORY_CARE_PROVIDER_SITE_OTHER): Payer: Medicaid Other | Admitting: Obstetrics and Gynecology

## 2019-03-08 DIAGNOSIS — M549 Dorsalgia, unspecified: Secondary | ICD-10-CM

## 2019-03-08 DIAGNOSIS — O0993 Supervision of high risk pregnancy, unspecified, third trimester: Secondary | ICD-10-CM | POA: Diagnosis not present

## 2019-03-08 DIAGNOSIS — O9989 Other specified diseases and conditions complicating pregnancy, childbirth and the puerperium: Secondary | ICD-10-CM | POA: Diagnosis not present

## 2019-03-08 DIAGNOSIS — O99891 Other specified diseases and conditions complicating pregnancy: Secondary | ICD-10-CM

## 2019-03-08 DIAGNOSIS — Z3A30 30 weeks gestation of pregnancy: Secondary | ICD-10-CM

## 2019-03-08 DIAGNOSIS — O0990 Supervision of high risk pregnancy, unspecified, unspecified trimester: Secondary | ICD-10-CM

## 2019-03-08 NOTE — Progress Notes (Signed)
ROB/Telephone visit C/o back pain  Denies lof, no vb, Good FM

## 2019-03-08 NOTE — Progress Notes (Signed)
I connected with Laurie Oliver  on 03/08/19 at  2:10 PM EDT by telephone and verified that I am speaking with the correct person using two identifiers.   I discussed the limitations, risks, security and privacy concerns of performing an evaluation and management service by telephone and the availability of in person appointments. I also discussed with the patient that there may be a patient responsible charge related to this service. The patient expressed understanding and agreed to proceed.  The patient was at home I spoke with the patient from my workstation phone The names of people involved in this encounter were: Laurie Oliver , and Malachy Mood   Routine Prenatal Care Visit  Subjective  Laurie Oliver is a 33 y.o. [phone] at [redacted]w[redacted]d being seen today for ongoing prenatal care.  She is currently monitored for the following issues for this low-risk pregnancy and has Hepatitis C, chronic ([place]); Supervision of high risk pregnancy, antepartum; Alcohol abuse complicating pregnancy; Smoking (tobacco) complicating pregnancy, unspecified trimester; and Back pain affecting pregnancy on their problem list.  ----------------------------------------------------------------------------------- Patient reports backache.   Contractions: Not present. Vag. Bleeding: None.  Movement: Present. Denies leaking of fluid.  ----------------------------------------------------------------------------------- The following portions of the patient's history were reviewed and updated as appropriate: allergies, current medications, past family history, past medical history, past social history, past surgical history and problem list. Problem list updated.   Objective  Last menstrual period 09/09/2018, unknown if currently breastfeeding. Pregravid weight 119 lb (54 kg) Total Weight Gain 19 lb (8.618 kg) Urinalysis:      Fetal Status:     Movement: Present     General:  Alert, oriented and cooperative.  Patient is in no acute distress.  Skin: Skin is warm and dry. No rash noted.   Cardiovascular: Normal heart rate noted  Respiratory: Normal respiratory effort, no problems with respiration noted  Abdomen: Soft, gravid, appropriate for gestational age. Pain/Pressure: Present     Pelvic:  Cervical exam deferred        Extremities: Normal range of motion.     ental Status: Normal mood and affect. Normal behavior. Normal judgment and thought content.     Assessment   33 y.o. [id number] at [redacted]w[redacted]d by  05/15/2019, by Ultrasound presenting for routine prenatal visit  Plan   Pregnancy #3 Problems (from 09/09/18 to present)    Problem Noted Resolved   Back pain affecting pregnancy 02/22/2019 by Will Bonnet, MD No   Supervision of high risk pregnancy, antepartum 11/02/2018 by Homero Fellers, MD No   Overview Addendum 02/23/2019  8:04 AM by Malachy Mood, MD      Clinic [facility] Prenatal Labs  Dating  12 wk [place] Oliver type: O/Positive/-- (02/12 1458)   Genetic Screen NIPS: normal XX   Antibody:Negative (02/12 1458)  Anatomic [place] Complete Rubella: 1.38 (02/12 1458) Varicella: Immune  GTT 129 RPR: Non Reactive (02/12 1458)   Rhogam N/A HBsAg: Negative (02/12 1458)   TDaP vaccine                       HIV: Non Reactive (02/12 1458)   Flu Shot   Declines                             GBS:   Contraception  Pap:  CBB     CS/VBAC    Baby Food    Support Person  Alcohol abuse complicating pregnancy 11/02/2018 by Natale MilchSchuman, Christanna R, MD No   Smoking (tobacco) complicating pregnancy, unspecified trimester 11/02/2018 by Natale MilchSchuman, Christanna R, MD No       Gestational age appropriate obstetric precautions including but not limited to vaginal bleeding, contractions, leaking of fluid and fetal movement were reviewed in detail with the patient.    - Back pain ongoing throughout pregnancy.  Has had one visit with PT.  Discussed no narcotics given concurrent subutex use.  Declines  trial of flexaril   Return in about 2 weeks (around 03/22/2019) for ROB in appear.  Vena AustriaAndreas Mariana Wiederholt, MD, Evern CoreFACOG [facility], [facility] 03/08/2019, 2:38 PM

## 2019-03-09 ENCOUNTER — Ambulatory Visit: Payer: Medicaid Other

## 2019-03-16 ENCOUNTER — Ambulatory Visit: Payer: Medicaid Other

## 2019-03-22 ENCOUNTER — Other Ambulatory Visit: Payer: Self-pay

## 2019-03-22 ENCOUNTER — Ambulatory Visit (INDEPENDENT_AMBULATORY_CARE_PROVIDER_SITE_OTHER): Payer: Medicaid Other | Admitting: Obstetrics and Gynecology

## 2019-03-22 VITALS — BP 130/80 | Wt 139.0 lb

## 2019-03-22 DIAGNOSIS — O99330 Smoking (tobacco) complicating pregnancy, unspecified trimester: Secondary | ICD-10-CM

## 2019-03-22 DIAGNOSIS — Z23 Encounter for immunization: Secondary | ICD-10-CM

## 2019-03-22 DIAGNOSIS — M549 Dorsalgia, unspecified: Secondary | ICD-10-CM

## 2019-03-22 DIAGNOSIS — O9989 Other specified diseases and conditions complicating pregnancy, childbirth and the puerperium: Secondary | ICD-10-CM

## 2019-03-22 DIAGNOSIS — O0990 Supervision of high risk pregnancy, unspecified, unspecified trimester: Secondary | ICD-10-CM

## 2019-03-22 DIAGNOSIS — F1010 Alcohol abuse, uncomplicated: Secondary | ICD-10-CM

## 2019-03-22 DIAGNOSIS — O0993 Supervision of high risk pregnancy, unspecified, third trimester: Secondary | ICD-10-CM

## 2019-03-22 DIAGNOSIS — Z3A32 32 weeks gestation of pregnancy: Secondary | ICD-10-CM

## 2019-03-22 DIAGNOSIS — O99333 Smoking (tobacco) complicating pregnancy, third trimester: Secondary | ICD-10-CM

## 2019-03-22 DIAGNOSIS — O99891 Other specified diseases and conditions complicating pregnancy: Secondary | ICD-10-CM

## 2019-03-22 DIAGNOSIS — O99313 Alcohol use complicating pregnancy, third trimester: Secondary | ICD-10-CM

## 2019-03-22 DIAGNOSIS — B182 Chronic viral hepatitis C: Secondary | ICD-10-CM

## 2019-03-22 DIAGNOSIS — O26843 Uterine size-date discrepancy, third trimester: Secondary | ICD-10-CM

## 2019-03-22 DIAGNOSIS — L239 Allergic contact dermatitis, unspecified cause: Secondary | ICD-10-CM

## 2019-03-22 LAB — POCT URINALYSIS DIPSTICK OB
Glucose, UA: NEGATIVE
POC,PROTEIN,UA: NEGATIVE

## 2019-03-22 MED ORDER — TRIAMCINOLONE ACETONIDE 0.1 % EX CREA: 1.0000 "application " | TOPICAL_CREAM | Freq: Two times a day (BID) | CUTANEOUS | 1 refills | Status: DC

## 2019-03-22 NOTE — Progress Notes (Signed)
Routine Prenatal Care Visit  Subjective  Laurie Oliver is a 33 y.o. [id number] at [redacted]w[redacted]d being seen today for ongoing prenatal care.  She is currently monitored for the following issues for this high-risk pregnancy and has Hepatitis C, chronic ([place]); Supervision of high risk pregnancy, antepartum; Alcohol abuse complicating pregnancy; Smoking (tobacco) complicating pregnancy, unspecified trimester; and Back pain affecting pregnancy on their problem list.  ----------------------------------------------------------------------------------- Patient reports was outside developed rash back of thigh and arms.  Puritic.  Marland Kitchen   Contractions: Not present. Vag. Bleeding: None.  Movement: Present. Denies leaking of fluid.  ----------------------------------------------------------------------------------- The following portions of the patient's history were reviewed and updated as appropriate: allergies, current medications, past family history, past medical history, past social history, past surgical history and problem list. Problem list updated.   Objective  Blood pressure 130/80, weight 139 lb (63 kg), last menstrual period 09/09/2018, unknown if currently breastfeeding. Pregravid weight 119 lb (54 kg) Total Weight Gain 20 lb (9.072 kg) Urinalysis:      Fetal Status:     Movement: Present     General:  Alert, oriented and cooperative. Patient is in no acute distress.  Skin: Skin is warm and dry. No rash noted.   Cardiovascular: Normal heart rate noted  Respiratory: Normal respiratory effort, no problems with respiration noted  Abdomen: Soft, gravid, appropriate for gestational age. Pain/Pressure: Absent     Pelvic:  Cervical exam deferred        Extremities: Normal range of motion.     ental Status: Normal mood and affect. Normal behavior. Normal judgment and thought content.     Assessment   33 y.o. [id number] at [redacted]w[redacted]d by  05/15/2019, by Ultrasound presenting for routine prenatal visit  Plan    Pregnancy #3 Problems (from 09/09/18 to present)    Problem Noted Resolved   Back pain affecting pregnancy 02/22/2019 by Will Bonnet, MD No   Supervision of high risk pregnancy, antepartum 11/02/2018 by Homero Fellers, MD No   Overview Addendum 02/23/2019  8:04 AM by Malachy Mood, MD      [facility]inic [facility] Prenatal Labs  Dating  12 wk [place] Blood type: O/Positive/-- (02/12 1458)   Genetic Screen NIPS: normal XX   Antibody:Negative (02/12 1458)  Anatomic [place] Complete Rubella: 1.38 (02/12 1458) Varicella: Immune  GTT 129 RPR: Non Reactive (02/12 1458)   Rhogam N/A HBsAg: Negative (02/12 1458)   TDaP vaccine                       HIV: Non Reactive (02/12 1458)   Flu Shot   Declines                             GBS:   Contraception  Pap:  CBB     CS/VBAC    Baby Food    Support Person             Alcohol abuse complicating pregnancy 9/76/7341 by Homero Fellers, MD No   Smoking (tobacco) complicating pregnancy, unspecified trimester 11/02/2018 by Homero Fellers, MD No       Gestational age appropriate obstetric precautions including but not limited to vaginal bleeding, contractions, leaking of fluid and fetal movement were reviewed in detail with the patient.    - Growth scan S<D history of IUGR - Triamcinolone for contact dermatitis  Return in about 2 weeks (around 04/05/2019) for [place].  Conan Bowens  Bonney AidStaebler, MD, Merlinda FrederickFACOG [facility], [facility] 03/22/2019, 2:18 PM

## 2019-03-23 ENCOUNTER — Ambulatory Visit: Payer: Medicaid Other | Attending: Obstetrics and Gynecology

## 2019-03-23 ENCOUNTER — Telehealth: Payer: Self-pay | Admitting: Obstetrics and Gynecology

## 2019-03-23 NOTE — Telephone Encounter (Signed)
Sherita from health Dept calling to check status of patient's last appointment. She has been unable to reach patient

## 2019-03-30 ENCOUNTER — Ambulatory Visit: Payer: Medicaid Other

## 2019-04-07 ENCOUNTER — Encounter: Payer: Medicaid Other | Admitting: Obstetrics and Gynecology

## 2019-04-07 ENCOUNTER — Telehealth: Payer: Self-pay

## 2019-04-07 ENCOUNTER — Ambulatory Visit: Payer: Medicaid Other

## 2019-04-07 NOTE — Telephone Encounter (Signed)
Marcello Moores calling to discuss why pt is out of work so early; her [employer] is 8/24th.  [phone]  Spoke c Estill Bamberg and adv of severe back pain.  She will send information over to the claims dept.

## 2019-04-10 ENCOUNTER — Telehealth: Payer: Self-pay | Admitting: Obstetrics and Gynecology

## 2019-04-10 ENCOUNTER — Telehealth: Payer: Self-pay

## 2019-04-10 NOTE — Telephone Encounter (Signed)
Patient is calling about FMLA needing dates 03/19/19 to October 4. Re faxed a AR form needs to be emailed.

## 2019-04-10 NOTE — Telephone Encounter (Signed)
Advise

## 2019-04-10 NOTE — Telephone Encounter (Signed)
Pt calling Maudie Mercury, Stating she needs kim to call her. She needs a letter that states She can not work, Her job will not accept any other wording. Please advise

## 2019-04-11 ENCOUNTER — Encounter: Payer: Self-pay | Admitting: Obstetrics and Gynecology

## 2019-04-11 NOTE — Progress Notes (Signed)
Worsening lumbago, has tried conservative management as well as PT referral.  To start out of work at 36 weeks 04/17/2019

## 2019-04-11 NOTE — Telephone Encounter (Signed)
Patient is calling to speak with Laurie Oliver about her FMLA. Please call patient.

## 2019-04-13 ENCOUNTER — Encounter: Payer: Medicaid Other | Admitting: Certified Nurse Midwife

## 2019-04-13 ENCOUNTER — Ambulatory Visit: Payer: Medicaid Other

## 2019-04-14 NOTE — Telephone Encounter (Signed)
Called and left voice mail for patient to call back to be schedule °

## 2019-04-17 NOTE — Telephone Encounter (Signed)
Left pt a mychart message that I had sent her recent notes and the note stating she could not work to her [employer] and to the two different fax numbers provided

## 2019-04-18 ENCOUNTER — Other Ambulatory Visit: Payer: Self-pay | Admitting: Obstetrics and Gynecology

## 2019-04-18 ENCOUNTER — Ambulatory Visit (INDEPENDENT_AMBULATORY_CARE_PROVIDER_SITE_OTHER): Payer: Medicaid Other

## 2019-04-18 ENCOUNTER — Encounter: Payer: Medicaid Other | Admitting: Maternal Newborn

## 2019-04-18 ENCOUNTER — Other Ambulatory Visit: Payer: Self-pay

## 2019-04-18 ENCOUNTER — Ambulatory Visit (INDEPENDENT_AMBULATORY_CARE_PROVIDER_SITE_OTHER): Payer: Medicaid Other | Admitting: Maternal Newborn

## 2019-04-18 VITALS — BP 130/80 | Wt 139.0 lb

## 2019-04-18 DIAGNOSIS — O365930 Maternal care for other known or suspected poor fetal growth, third trimester, not applicable or unspecified: Secondary | ICD-10-CM

## 2019-04-18 DIAGNOSIS — Z369 Encounter for antenatal screening, unspecified: Secondary | ICD-10-CM

## 2019-04-18 DIAGNOSIS — O26843 Uterine size-date discrepancy, third trimester: Secondary | ICD-10-CM

## 2019-04-18 DIAGNOSIS — Z3A33 33 weeks gestation of pregnancy: Secondary | ICD-10-CM | POA: Diagnosis not present

## 2019-04-18 DIAGNOSIS — Z3A32 32 weeks gestation of pregnancy: Secondary | ICD-10-CM | POA: Diagnosis not present

## 2019-04-18 DIAGNOSIS — O0990 Supervision of high risk pregnancy, unspecified, unspecified trimester: Secondary | ICD-10-CM

## 2019-04-18 DIAGNOSIS — O0993 Supervision of high risk pregnancy, unspecified, third trimester: Secondary | ICD-10-CM

## 2019-04-18 DIAGNOSIS — Z3A36 36 weeks gestation of pregnancy: Secondary | ICD-10-CM

## 2019-04-18 NOTE — Progress Notes (Signed)
rou

## 2019-04-18 NOTE — Patient Instructions (Signed)
Third Trimester of Pregnancy The third trimester is from week 28 through week 40 (months 7 through 9). The third trimester is a time when the unborn baby (fetus) is growing rapidly. At the end of the ninth month, the fetus is about 20 inches in length and weighs 6-10 pounds. Body changes during your third trimester Your body will continue to go through many changes during pregnancy. The changes vary from woman to woman. During the third trimester:  Your weight will continue to increase. You can expect to gain 25-35 pounds (11-16 kg) by the end of the pregnancy.  You may begin to get stretch marks on your hips, abdomen, and breasts.  You may urinate more often because the fetus is moving lower into your pelvis and pressing on your bladder.  You may develop or continue to have heartburn. This is caused by increased hormones that slow down muscles in the digestive tract.  You may develop or continue to have constipation because increased hormones slow digestion and cause the muscles that push waste through your intestines to relax.  You may develop hemorrhoids. These are swollen veins (varicose veins) in the rectum that can itch or be painful.  You may develop swollen, bulging veins (varicose veins) in your legs.  You may have increased body aches in the pelvis, back, or thighs. This is due to weight gain and increased hormones that are relaxing your joints.  You may have changes in your hair. These can include thickening of your hair, rapid growth, and changes in texture. Some women also have hair loss during or after pregnancy, or hair that feels dry or thin. Your hair will most likely return to normal after your baby is born.  Your breasts will continue to grow and they will continue to become tender. A yellow fluid (colostrum) may leak from your breasts. This is the first milk you are producing for your baby.  Your belly button may stick out.  You may notice more swelling in your hands,  face, or ankles.  You may have increased tingling or numbness in your hands, arms, and legs. The skin on your belly may also feel numb.  You may feel short of breath because of your expanding uterus.  You may have more problems sleeping. This can be caused by the size of your belly, increased need to urinate, and an increase in your body's metabolism.  You may notice the fetus "dropping," or moving lower in your abdomen (lightening).  You may have increased vaginal discharge.  You may notice your joints feel loose and you may have pain around your pelvic bone. What to expect at prenatal visits You will have prenatal exams every 2 weeks until week 36. Then you will have weekly prenatal exams. During a routine prenatal visit:  You will be weighed to make sure you and the baby are growing normally.  Your blood pressure will be taken.  Your abdomen will be measured to track your baby's growth.  The fetal heartbeat will be listened to.  Any test results from the previous visit will be discussed.  You may have a cervical check near your due date to see if your cervix has softened or thinned (effaced).  You will be tested for Group B streptococcus. This happens between 35 and 37 weeks. Your health care provider may ask you:  What your birth plan is.  How you are feeling.  If you are feeling the baby move.  If you have had any abnormal   symptoms, such as leaking fluid, bleeding, severe headaches, or abdominal cramping.  If you are using any tobacco products, including cigarettes, chewing tobacco, and electronic cigarettes.  If you have any questions. Other tests or screenings that may be performed during your third trimester include:  Blood tests that check for low iron levels (anemia).  Fetal testing to check the health, activity level, and growth of the fetus. Testing is done if you have certain medical conditions or if there are problems during the pregnancy.  Nonstress test  (NST). This test checks the health of your baby to make sure there are no signs of problems, such as the baby not getting enough oxygen. During this test, a belt is placed around your belly. The baby is made to move, and its heart rate is monitored during movement. What is false labor? False labor is a condition in which you feel small, irregular tightenings of the muscles in the womb (contractions) that usually go away with rest, changing position, or drinking water. These are called Braxton Hicks contractions. Contractions may last for hours, days, or even weeks before true labor sets in. If contractions come at regular intervals, become more frequent, increase in intensity, or become painful, you should see your health care provider. What are the signs of labor?  Abdominal cramps.  Regular contractions that start at 10 minutes apart and become stronger and more frequent with time.  Contractions that start on the top of the uterus and spread down to the lower abdomen and back.  Increased pelvic pressure and dull back pain.  A watery or bloody mucus discharge that comes from the vagina.  Leaking of amniotic fluid. This is also known as your "water breaking." It could be a slow trickle or a gush. Let your health care provider know if it has a color or strange odor. If you have any of these signs, call your health care provider right away, even if it is before your due date. Follow these instructions at home: Medicines  Follow your health care provider's instructions regarding medicine use. Specific medicines may be either safe or unsafe to take during pregnancy.  Take a prenatal vitamin that contains at least 600 micrograms (mcg) of folic acid.  If you develop constipation, try taking a stool softener if your health care provider approves. Eating and drinking   Eat a balanced diet that includes fresh fruits and vegetables, whole grains, good sources of protein such as meat, eggs, or tofu,  and low-fat dairy. Your health care provider will help you determine the amount of weight gain that is right for you.  Avoid raw meat and uncooked cheese. These carry germs that can cause birth defects in the baby.  If you have low calcium intake from food, talk to your health care provider about whether you should take a daily calcium supplement.  Eat four or five small meals rather than three large meals a day.  Limit foods that are high in fat and processed sugars, such as fried and sweet foods.  To prevent constipation: ? Drink enough fluid to keep your urine clear or pale yellow. ? Eat foods that are high in fiber, such as fresh fruits and vegetables, whole grains, and beans. Activity  Exercise only as directed by your health care provider. Most women can continue their usual exercise routine during pregnancy. Try to exercise for 30 minutes at least 5 days a week. Stop exercising if you experience uterine contractions.  Avoid heavy lifting.  Do   not exercise in extreme heat or humidity, or at high altitudes.  Wear low-heel, comfortable shoes.  Practice good posture.  You may continue to have sex unless your health care provider tells you otherwise. Relieving pain and discomfort  Take frequent breaks and rest with your legs elevated if you have leg cramps or low back pain.  Take warm sitz baths to soothe any pain or discomfort caused by hemorrhoids. Use hemorrhoid cream if your health care provider approves.  Wear a good support bra to prevent discomfort from breast tenderness.  If you develop varicose veins: ? Wear support pantyhose or compression stockings as told by your healthcare provider. ? Elevate your feet for 15 minutes, 3-4 times a day. Prenatal care  Write down your questions. Take them to your prenatal visits.  Keep all your prenatal visits as told by your health care provider. This is important. Safety  Wear your seat belt at all times when driving.  Make  a list of emergency phone numbers, including numbers for family, friends, the hospital, and police and fire departments. General instructions  Avoid cat litter boxes and soil used by cats. These carry germs that can cause birth defects in the baby. If you have a cat, ask someone to clean the litter box for you.  Do not travel far distances unless it is absolutely necessary and only with the approval of your health care provider.  Do not use hot tubs, steam rooms, or saunas.  Do not drink alcohol.  Do not use any products that contain nicotine or tobacco, such as cigarettes and e-cigarettes. If you need help quitting, ask your health care provider.  Do not use any medicinal herbs or unprescribed drugs. These chemicals affect the formation and growth of the baby.  Do not douche or use tampons or scented sanitary pads.  Do not cross your legs for long periods of time.  To prepare for the arrival of your baby: ? Take prenatal classes to understand, practice, and ask questions about labor and delivery. ? Make a trial run to the hospital. ? Visit the hospital and tour the maternity area. ? Arrange for maternity or paternity leave through employers. ? Arrange for family and friends to take care of pets while you are in the hospital. ? Purchase a rear-facing car seat and make sure you know how to install it in your car. ? Pack your hospital bag. ? Prepare the baby's nursery. Make sure to remove all pillows and stuffed animals from the baby's crib to prevent suffocation.  Visit your dentist if you have not gone during your pregnancy. Use a soft toothbrush to brush your teeth and be gentle when you floss. Contact a health care provider if:  You are unsure if you are in labor or if your water has broken.  You become dizzy.  You have mild pelvic cramps, pelvic pressure, or nagging pain in your abdominal area.  You have lower back pain.  You have persistent nausea, vomiting, or diarrhea.   You have an unusual or bad smelling vaginal discharge.  You have pain when you urinate. Get help right away if:  Your water breaks before 37 weeks.  You have regular contractions less than 5 minutes apart before 37 weeks.  You have a fever.  You are leaking fluid from your vagina.  You have spotting or bleeding from your vagina.  You have severe abdominal pain or cramping.  You have rapid weight loss or weight gain.  You have   shortness of breath with chest pain.  You notice sudden or extreme swelling of your face, hands, ankles, feet, or legs.  Your baby makes fewer than 10 movements in 2 hours.  You have severe headaches that do not go away when you take medicine.  You have vision changes. Summary  The third trimester is from week 28 through week 40, months 7 through 9. The third trimester is a time when the unborn baby (fetus) is growing rapidly.  During the third trimester, your discomfort may increase as you and your baby continue to gain weight. You may have abdominal, leg, and back pain, sleeping problems, and an increased need to urinate.  During the third trimester your breasts will keep growing and they will continue to become tender. A yellow fluid (colostrum) may leak from your breasts. This is the first milk you are producing for your baby.  False labor is a condition in which you feel small, irregular tightenings of the muscles in the womb (contractions) that eventually go away. These are called Braxton Hicks contractions. Contractions may last for hours, days, or even weeks before true labor sets in.  Signs of labor can include: abdominal cramps; regular contractions that start at 10 minutes apart and become stronger and more frequent with time; watery or bloody mucus discharge that comes from the vagina; increased pelvic pressure and dull back pain; and leaking of amniotic fluid. This information is not intended to replace advice given to you by your health  care provider. Make sure you discuss any questions you have with your health care provider. Document Released: 09/01/2001 Document Revised: 12/29/2018 Document Reviewed: 10/13/2016 Elsevier Patient Education  2020 [employer]  

## 2019-04-18 NOTE — Progress Notes (Signed)
Routine Prenatal Care Visit  Subjective  Laurie Oliver is a 33 y.o. G5P1021 at 5846w1d being seen today for ongoing prenatal care.  She is currently monitored for the following issues for this high-risk pregnancy and has Hepatitis C, chronic (HCC); Supervision of high risk pregnancy, antepartum; Alcohol abuse complicating pregnancy; Smoking (tobacco) complicating pregnancy, unspecified trimester; and Back pain affecting pregnancy on their problem list.  ----------------------------------------------------------------------------------- Patient reports some itchy spots, may be mosquito or chigger bites.  Contractions: Not present. Vag. Bleeding: None.  Movement: Present. No leaking of fluid.  ----------------------------------------------------------------------------------- The following portions of the patient's history were reviewed and updated as appropriate: allergies, current medications, past family history, past medical history, past social history, past surgical history and problem list. Problem list updated.   Objective  Blood pressure 130/80, weight 139 lb (63 kg), last menstrual period 09/09/2018.Marland Kitchen. Pregravid weight 119 lb (54 kg) Total Weight Gain 20 lb (9.072 kg)  Fetal Status: Fetal Heart Rate (bpm): 138 (US)   Movement: Present     General:  Alert, oriented and cooperative. Patient is in no acute distress.  Skin: Skin is warm and dry. No rash noted.   Cardiovascular: Normal heart rate noted  Respiratory: Normal respiratory effort, no problems with respiration noted  Abdomen: Soft, gravid, appropriate for gestational age. Pain/Pressure: Absent     Pelvic:  Cervical exam deferred        Extremities: Normal range of motion.     Mental Status: Normal mood and affect. Normal behavior. Normal judgment and thought content.     Assessment   32 y.o. [id number] at 6046w1d, EDD 05/15/2019 by Ultrasound presenting for a routine prenatal visit.  Plan   Pregnancy #3 Problems (from  09/09/18 to present)    Problem Noted Resolved   Back pain affecting pregnancy 02/22/2019 by Conard NovakJackson, Stephen D, MD No   Supervision of high risk pregnancy, antepartum 11/02/2018 by Natale MilchSchuman, Christanna R, MD No   Overview Addendum 02/23/2019  8:04 AM by Vena AustriaStaebler, Andreas, MD      [facility] Prenatal Labs  Dating  12 wk US Blood type: O/Positive/-- (02/12 1458)   Genetic Screen NIPS: normal XX   Antibody:Negative (02/12 1458)  Anatomic US Complete Rubella: 1.38 (02/12 1458) Varicella: Immune  GTT 129 RPR: Non Reactive (02/12 1458)   Rhogam N/A HBsAg: Negative (02/12 1458)   TDaP vaccine                       HIV: Non Reactive (02/12 1458)   Flu Shot   Declines                             GBS:   Contraception  Pap:  CBB     CS/VBAC    Baby Food    Support Person             Alcohol abuse complicating pregnancy 11/02/2018 by Natale MilchSchuman, Christanna R, MD No   Smoking (tobacco) complicating pregnancy, unspecified trimester 11/02/2018 by Natale MilchSchuman, Christanna R, MD No    Ultrasound today shows fetal growth at less than 10th percentile, EFW 5 lb, FHR 138 bpm, cephalic presentation. BPP was 8/8,  UA dopplers with normal flow, slightly elevated S/D ratio. Results were reviewed with patient and Dr. Bonney AidStaebler.  MD recommended twice weekly NST, weekly BPP/AFI with delivery planning for 38-39 weeks.  May use triamcinolone cream that she was previously prescribed for itchy spots.  Preterm labor symptoms and general obstetric precautions including but not limited to vaginal bleeding, contractions, leaking of fluid and fetal movement were reviewed.  Please refer to After Visit Summary for other counseling recommendations.   Return in about 3 days (around 04/21/2019) for ROB with NST.  Avel Sensor, CNM 04/18/2019  10:59 AM

## 2019-04-19 ENCOUNTER — Encounter: Payer: Self-pay | Admitting: Maternal Newborn

## 2019-04-21 ENCOUNTER — Ambulatory Visit (INDEPENDENT_AMBULATORY_CARE_PROVIDER_SITE_OTHER): Payer: Medicaid Other | Admitting: Certified Nurse Midwife

## 2019-04-21 ENCOUNTER — Other Ambulatory Visit: Payer: Self-pay

## 2019-04-21 ENCOUNTER — Encounter: Payer: Self-pay | Admitting: Certified Nurse Midwife

## 2019-04-21 VITALS — BP 120/70 | Wt 138.8 lb

## 2019-04-21 DIAGNOSIS — F1120 Opioid dependence, uncomplicated: Secondary | ICD-10-CM | POA: Insufficient documentation

## 2019-04-21 DIAGNOSIS — Z3A36 36 weeks gestation of pregnancy: Secondary | ICD-10-CM | POA: Diagnosis not present

## 2019-04-21 DIAGNOSIS — O365930 Maternal care for other known or suspected poor fetal growth, third trimester, not applicable or unspecified: Secondary | ICD-10-CM | POA: Insufficient documentation

## 2019-04-21 DIAGNOSIS — O0993 Supervision of high risk pregnancy, unspecified, third trimester: Secondary | ICD-10-CM

## 2019-04-21 DIAGNOSIS — Z8619 Personal history of other infectious and parasitic diseases: Secondary | ICD-10-CM

## 2019-04-21 DIAGNOSIS — O99311 Alcohol use complicating pregnancy, first trimester: Secondary | ICD-10-CM

## 2019-04-21 DIAGNOSIS — O99320 Drug use complicating pregnancy, unspecified trimester: Secondary | ICD-10-CM | POA: Insufficient documentation

## 2019-04-21 DIAGNOSIS — O0990 Supervision of high risk pregnancy, unspecified, unspecified trimester: Secondary | ICD-10-CM

## 2019-04-21 DIAGNOSIS — O99330 Smoking (tobacco) complicating pregnancy, unspecified trimester: Secondary | ICD-10-CM

## 2019-04-21 DIAGNOSIS — O99333 Smoking (tobacco) complicating pregnancy, third trimester: Secondary | ICD-10-CM

## 2019-04-21 HISTORY — DX: Alcohol use complicating pregnancy, first trimester: O99.311

## 2019-04-21 LAB — POCT URINALYSIS DIPSTICK OB
Glucose, UA: NEGATIVE
POC,PROTEIN,UA: NEGATIVE

## 2019-04-21 LAB — STREP GP B NAA: Strep Gp B NAA: NEGATIVE

## 2019-04-21 NOTE — Progress Notes (Signed)
ROB/NST- no concerns 

## 2019-04-23 ENCOUNTER — Encounter: Payer: Self-pay | Admitting: Certified Nurse Midwife

## 2019-04-23 NOTE — Progress Notes (Signed)
HROB/ NST at 36wk4 days: IUGR (EFW<10%) / currently on Subutex S: Reports good FM. Concerned about notations regarding alcohol abuse in pregnancy and hepatitis C. Dranks alcohol in first trimester before she knew she was pregnant, but reports not drinking excessively or daily. In addition she was treated for hepatitis C and it is now resolved. Continues on Subutex Will be breast and bottle feeding Thinking about using Depo for contraception  O: NST reactive with baseline 120 and accelerations to 140s, moderate variabi GBS was negative  A: IUP at 36wk4d with FGR with reactive NST and normal Dopplers/ AFI/BPP  P: Continue twice weekly NSTs and weekly BPP/Dopplers Problem list updated  Dalia Heading, CNM

## 2019-04-23 NOTE — Progress Notes (Signed)
HROB

## 2019-04-25 ENCOUNTER — Other Ambulatory Visit: Payer: Self-pay | Admitting: Maternal Newborn

## 2019-04-25 ENCOUNTER — Ambulatory Visit (INDEPENDENT_AMBULATORY_CARE_PROVIDER_SITE_OTHER): Payer: Medicaid Other | Admitting: Maternal Newborn

## 2019-04-25 ENCOUNTER — Encounter: Payer: Self-pay | Admitting: Maternal Newborn

## 2019-04-25 ENCOUNTER — Other Ambulatory Visit: Payer: Self-pay

## 2019-04-25 ENCOUNTER — Ambulatory Visit (INDEPENDENT_AMBULATORY_CARE_PROVIDER_SITE_OTHER): Payer: Medicaid Other

## 2019-04-25 VITALS — BP 100/64 | Wt 139.4 lb

## 2019-04-25 DIAGNOSIS — O365990 Maternal care for other known or suspected poor fetal growth, unspecified trimester, not applicable or unspecified: Secondary | ICD-10-CM

## 2019-04-25 DIAGNOSIS — O0990 Supervision of high risk pregnancy, unspecified, unspecified trimester: Secondary | ICD-10-CM

## 2019-04-25 DIAGNOSIS — Z3A37 37 weeks gestation of pregnancy: Secondary | ICD-10-CM

## 2019-04-25 DIAGNOSIS — O365930 Maternal care for other known or suspected poor fetal growth, third trimester, not applicable or unspecified: Secondary | ICD-10-CM

## 2019-04-25 DIAGNOSIS — Z369 Encounter for antenatal screening, unspecified: Secondary | ICD-10-CM

## 2019-04-25 LAB — POCT URINALYSIS DIPSTICK OB
Glucose, UA: NEGATIVE
POC,PROTEIN,UA: NEGATIVE

## 2019-04-25 NOTE — Progress Notes (Signed)
Routine Prenatal Care Visit  Subjective  Laurie Oliver is a 33 y.o. G5P1021 at [redacted]w[redacted]d being seen today for ongoing prenatal care.  She is currently monitored for the following issues for this high-risk pregnancy and has History of hepatitis C; Supervision of high risk pregnancy, antepartum; Smoking (tobacco) complicating pregnancy, unspecified trimester; Back pain affecting pregnancy; Pregnancy complicated by subutex maintenance, antepartum ([place]); and Intrauterine growth restriction (IUGR) affecting care of mother, third trimester, single gestation on their problem list.  ----------------------------------------------------------------------------------- Patient reports discomforts of late pregnancy: pelvic pressure, pain when baby moves in certain positions. Contractions: Not present. Vag. Bleeding: None.  Movement: Present. No leaking of fluid.  ----------------------------------------------------------------------------------- The following portions of the patient's history were reviewed and updated as appropriate: allergies, current medications, past family history, past medical history, past social history, past surgical history and problem list. Problem list updated.   Objective  Blood pressure 100/64, weight 139 lb 6.4 oz (63.2 kg), last menstrual period 09/09/2018, unknown if currently breastfeeding. Pregravid weight 119 lb (54 kg) Total Weight Gain 20 lb 6.4 oz (9.253 kg) Urinalysis: Urine dipstick shows negative for glucose, protein.  Fetal Status: Fetal Heart Rate (bpm): 135   Movement: Present  Presentation: Vertex  General:  Alert, oriented and cooperative. Patient is in no acute distress.  Skin: Skin is warm and dry. No rash noted.   Cardiovascular: Normal heart rate noted  Respiratory: Normal respiratory effort, no problems with respiration noted  Abdomen: Soft, gravid, appropriate for gestational age. Pain/Pressure: Present     Pelvic:  Cervical exam deferred         Extremities: Normal range of motion.  Edema: None  Mental Status: Normal mood and affect. Normal behavior. Normal judgment and thought content.   NST Baseline: 135 Variability: moderate Accelerations: present Decelerations: absent Tocometry: not done The patient was monitored for 20 minutes, fetal heart rate tracing was deemed reactive.  Assessment   33 y.o. [id number] at [redacted]w[redacted]d EDD 05/15/2019, by Ultrasound presenting for a routine prenatal visit.  Plan   Pregnancy #3 Problems (from 09/09/18 to present)    Problem Noted Resolved   Back pain affecting pregnancy 02/22/2019 by Will Bonnet, MD No   Supervision of high risk pregnancy, antepartum 11/02/2018 by Homero Fellers, MD No   Overview Addendum 04/23/2019 11:27 AM by Dalia Heading, [facility] Prenatal Labs  Dating  12 wk [place] Blood type: O/Positive/-- (02/12 1458)   Genetic Screen NIPS: normal XX   Antibody:Negative (02/12 1458)  Anatomic [place] Complete Rubella: 1.38 (02/12 1458) Varicella: Immune  GTT 129 RPR: Non Reactive (02/12 1458)   Rhogam N/A HBsAg: Negative (02/12 1458)   TDaP vaccine           03/22/2019            HIV: Non Reactive (02/12 1458)   Flu Shot   Declines                             GBS: negative  Contraception Depo Pap:  CBB     CS/VBAC    Baby Food Breast/bottle   Support Person             Smoking (tobacco) complicating pregnancy, unspecified trimester 11/02/2018 by Homero Fellers, MD No   Alcohol abuse complicating pregnancy 4/40/3474 by Homero Fellers, MD 04/21/2019 by Dalia Heading, CNM      BPP was 8/8  today with AFI 11.7 cm; cephalic presentation. NST reactive.  Please refer to After Visit Summary for other counseling recommendations.  Keep ROB with NST on 8/7 and return in about 1 week (around 05/02/2019) for ROB with NST and BPP/AFI.  Marcelyn BruinsJacelyn Dwanda Tufano, CNM 04/25/2019  3:00 PM

## 2019-04-25 NOTE — Patient Instructions (Signed)
Third Trimester of Pregnancy The third trimester is from week 28 through week 40 (months 7 through 9). The third trimester is a time when the unborn baby (fetus) is growing rapidly. At the end of the ninth month, the fetus is about 20 inches in length and weighs 6-10 pounds. Body changes during your third trimester Your body will continue to go through many changes during pregnancy. The changes vary from woman to woman. During the third trimester:  Your weight will continue to increase. You can expect to gain 25-35 pounds (11-16 kg) by the end of the pregnancy.  You may begin to get stretch marks on your hips, abdomen, and breasts.  You may urinate more often because the fetus is moving lower into your pelvis and pressing on your bladder.  You may develop or continue to have heartburn. This is caused by increased hormones that slow down muscles in the digestive tract.  You may develop or continue to have constipation because increased hormones slow digestion and cause the muscles that push waste through your intestines to relax.  You may develop hemorrhoids. These are swollen veins (varicose veins) in the rectum that can itch or be painful.  You may develop swollen, bulging veins (varicose veins) in your legs.  You may have increased body aches in the pelvis, back, or thighs. This is due to weight gain and increased hormones that are relaxing your joints.  You may have changes in your hair. These can include thickening of your hair, rapid growth, and changes in texture. Some women also have hair loss during or after pregnancy, or hair that feels dry or thin. Your hair will most likely return to normal after your baby is born.  Your breasts will continue to grow and they will continue to become tender. A yellow fluid (colostrum) may leak from your breasts. This is the first milk you are producing for your baby.  Your belly button may stick out.  You may notice more swelling in your hands,  face, or ankles.  You may have increased tingling or numbness in your hands, arms, and legs. The skin on your belly may also feel numb.  You may feel short of breath because of your expanding uterus.  You may have more problems sleeping. This can be caused by the size of your belly, increased need to urinate, and an increase in your body's metabolism.  You may notice the fetus "dropping," or moving lower in your abdomen (lightening).  You may have increased vaginal discharge.  You may notice your joints feel loose and you may have pain around your pelvic bone. What to expect at prenatal visits You will have prenatal exams every 2 weeks until week 36. Then you will have weekly prenatal exams. During a routine prenatal visit:  You will be weighed to make sure you and the baby are growing normally.  Your blood pressure will be taken.  Your abdomen will be measured to track your baby's growth.  The fetal heartbeat will be listened to.  Any test results from the previous visit will be discussed.  You may have a cervical check near your due date to see if your cervix has softened or thinned (effaced).  You will be tested for Group B streptococcus. This happens between 35 and 37 weeks. Your health care provider may ask you:  What your birth plan is.  How you are feeling.  If you are feeling the baby move.  If you have had any abnormal   symptoms, such as leaking fluid, bleeding, severe headaches, or abdominal cramping.  If you are using any tobacco products, including cigarettes, chewing tobacco, and electronic cigarettes.  If you have any questions. Other tests or screenings that may be performed during your third trimester include:  Blood tests that check for low iron levels (anemia).  Fetal testing to check the health, activity level, and growth of the fetus. Testing is done if you have certain medical conditions or if there are problems during the pregnancy.  Nonstress test  (NST). This test checks the health of your baby to make sure there are no signs of problems, such as the baby not getting enough oxygen. During this test, a belt is placed around your belly. The baby is made to move, and its heart rate is monitored during movement. What is false labor? False labor is a condition in which you feel small, irregular tightenings of the muscles in the womb (contractions) that usually go away with rest, changing position, or drinking water. These are called Braxton Hicks contractions. Contractions may last for hours, days, or even weeks before true labor sets in. If contractions come at regular intervals, become more frequent, increase in intensity, or become painful, you should see your health care provider. What are the signs of labor?  Abdominal cramps.  Regular contractions that start at 10 minutes apart and become stronger and more frequent with time.  Contractions that start on the top of the uterus and spread down to the lower abdomen and back.  Increased pelvic pressure and dull back pain.  A watery or bloody mucus discharge that comes from the vagina.  Leaking of amniotic fluid. This is also known as your "water breaking." It could be a slow trickle or a gush. Let your health care provider know if it has a color or strange odor. If you have any of these signs, call your health care provider right away, even if it is before your due date. Follow these instructions at home: Medicines  Follow your health care provider's instructions regarding medicine use. Specific medicines may be either safe or unsafe to take during pregnancy.  Take a prenatal vitamin that contains at least 600 micrograms (mcg) of folic acid.  If you develop constipation, try taking a stool softener if your health care provider approves. Eating and drinking   Eat a balanced diet that includes fresh fruits and vegetables, whole grains, good sources of protein such as meat, eggs, or tofu,  and low-fat dairy. Your health care provider will help you determine the amount of weight gain that is right for you.  Avoid raw meat and uncooked cheese. These carry germs that can cause birth defects in the baby.  If you have low calcium intake from food, talk to your health care provider about whether you should take a daily calcium supplement.  Eat four or five small meals rather than three large meals a day.  Limit foods that are high in fat and processed sugars, such as fried and sweet foods.  To prevent constipation: ? Drink enough fluid to keep your urine clear or pale yellow. ? Eat foods that are high in fiber, such as fresh fruits and vegetables, whole grains, and beans. Activity  Exercise only as directed by your health care provider. Most women can continue their usual exercise routine during pregnancy. Try to exercise for 30 minutes at least 5 days a week. Stop exercising if you experience uterine contractions.  Avoid heavy lifting.  Do   not exercise in extreme heat or humidity, or at high altitudes.  Wear low-heel, comfortable shoes.  Practice good posture.  You may continue to have sex unless your health care provider tells you otherwise. Relieving pain and discomfort  Take frequent breaks and rest with your legs elevated if you have leg cramps or low back pain.  Take warm sitz baths to soothe any pain or discomfort caused by hemorrhoids. Use hemorrhoid cream if your health care provider approves.  Wear a good support bra to prevent discomfort from breast tenderness.  If you develop varicose veins: ? Wear support pantyhose or compression stockings as told by your healthcare provider. ? Elevate your feet for 15 minutes, 3-4 times a day. Prenatal care  Write down your questions. Take them to your prenatal visits.  Keep all your prenatal visits as told by your health care provider. This is important. Safety  Wear your seat belt at all times when driving.  Make  a list of emergency phone numbers, including numbers for family, friends, the hospital, and police and fire departments. General instructions  Avoid cat litter boxes and soil used by cats. These carry germs that can cause birth defects in the baby. If you have a cat, ask someone to clean the litter box for you.  Do not travel far distances unless it is absolutely necessary and only with the approval of your health care provider.  Do not use hot tubs, steam rooms, or saunas.  Do not drink alcohol.  Do not use any products that contain nicotine or tobacco, such as cigarettes and e-cigarettes. If you need help quitting, ask your health care provider.  Do not use any medicinal herbs or unprescribed drugs. These chemicals affect the formation and growth of the baby.  Do not douche or use tampons or scented sanitary pads.  Do not cross your legs for long periods of time.  To prepare for the arrival of your baby: ? Take prenatal classes to understand, practice, and ask questions about labor and delivery. ? Make a trial run to the hospital. ? Visit the hospital and tour the maternity area. ? Arrange for maternity or paternity leave through employers. ? Arrange for family and friends to take care of pets while you are in the hospital. ? Purchase a rear-facing car seat and make sure you know how to install it in your car. ? Pack your hospital bag. ? Prepare the baby's nursery. Make sure to remove all pillows and stuffed animals from the baby's crib to prevent suffocation.  Visit your dentist if you have not gone during your pregnancy. Use a soft toothbrush to brush your teeth and be gentle when you floss. Contact a health care provider if:  You are unsure if you are in labor or if your water has broken.  You become dizzy.  You have mild pelvic cramps, pelvic pressure, or nagging pain in your abdominal area.  You have lower back pain.  You have persistent nausea, vomiting, or diarrhea.   You have an unusual or bad smelling vaginal discharge.  You have pain when you urinate. Get help right away if:  Your water breaks before 37 weeks.  You have regular contractions less than 5 minutes apart before 37 weeks.  You have a fever.  You are leaking fluid from your vagina.  You have spotting or bleeding from your vagina.  You have severe abdominal pain or cramping.  You have rapid weight loss or weight gain.  You have   shortness of breath with chest pain.  You notice sudden or extreme swelling of your face, hands, ankles, feet, or legs.  Your baby makes fewer than 10 movements in 2 hours.  You have severe headaches that do not go away when you take medicine.  You have vision changes. Summary  The third trimester is from week 28 through week 40, months 7 through 9. The third trimester is a time when the unborn baby (fetus) is growing rapidly.  During the third trimester, your discomfort may increase as you and your baby continue to gain weight. You may have abdominal, leg, and back pain, sleeping problems, and an increased need to urinate.  During the third trimester your breasts will keep growing and they will continue to become tender. A yellow fluid (colostrum) may leak from your breasts. This is the first milk you are producing for your baby.  False labor is a condition in which you feel small, irregular tightenings of the muscles in the womb (contractions) that eventually go away. These are called Braxton Hicks contractions. Contractions may last for hours, days, or even weeks before true labor sets in.  Signs of labor can include: abdominal cramps; regular contractions that start at 10 minutes apart and become stronger and more frequent with time; watery or bloody mucus discharge that comes from the vagina; increased pelvic pressure and dull back pain; and leaking of amniotic fluid. This information is not intended to replace advice given to you by your health  care provider. Make sure you discuss any questions you have with your health care provider. Document Released: 09/01/2001 Document Revised: 12/29/2018 Document Reviewed: 10/13/2016 Elsevier Patient Education  2020 [employer]  

## 2019-04-25 NOTE — Progress Notes (Signed)
ROB/AFI/NST- no concerns 

## 2019-04-28 ENCOUNTER — Encounter: Payer: Medicaid Other | Admitting: Advanced Practice Midwife

## 2019-05-01 ENCOUNTER — Telehealth: Payer: Self-pay

## 2019-05-01 NOTE — Telephone Encounter (Signed)
Patient is calling back to speak with Dr. Georgianne Fick. Please advise. Patient aware Dr. Georgianne Fick is currently seeing patient and message sent to Nurse

## 2019-05-01 NOTE — Telephone Encounter (Signed)
Spoke w/patient. Advised I will discuss with Domingo Sep to verify that we have the faxed forms she is needing completed. She can discuss her needs with him at her appointment in the morning and hopefully we can get it all cleared up. Patient agreeable & appreciative of call.

## 2019-05-01 NOTE — Telephone Encounter (Signed)
Pt requesting to speak to Dr. Georgianne Fick about her FMLA before she switches Dr's. Feels like she's getting the run around every time she calls regarding this. She was late today so she wasn't able to be seen. She has been r/s for Tuesday 05/02/2019. She needs to get her dates right on her FMLA forms. She was going to be approved til her due date 05/15/2019,  but now the form has to be completed for 6/28/-8/04 which is 16 wks. She states the [facility] office #doesn't work. Also reports she told them she may be having labor or false labor pains to which front desk staff advised that she would need to go to ER for. She feels the front desk is being short/rude with her. [phone].

## 2019-05-02 ENCOUNTER — Telehealth: Payer: Self-pay

## 2019-05-02 ENCOUNTER — Ambulatory Visit (INDEPENDENT_AMBULATORY_CARE_PROVIDER_SITE_OTHER): Payer: Medicaid Other | Admitting: Obstetrics and Gynecology

## 2019-05-02 ENCOUNTER — Other Ambulatory Visit: Payer: Self-pay

## 2019-05-02 ENCOUNTER — Other Ambulatory Visit: Payer: Medicaid Other

## 2019-05-02 VITALS — BP 126/72 | Wt 142.0 lb

## 2019-05-02 DIAGNOSIS — O99891 Other specified diseases and conditions complicating pregnancy: Secondary | ICD-10-CM

## 2019-05-02 DIAGNOSIS — O0990 Supervision of high risk pregnancy, unspecified, unspecified trimester: Secondary | ICD-10-CM

## 2019-05-02 DIAGNOSIS — Z3A38 38 weeks gestation of pregnancy: Secondary | ICD-10-CM | POA: Diagnosis not present

## 2019-05-02 DIAGNOSIS — O9989 Other specified diseases and conditions complicating pregnancy, childbirth and the puerperium: Secondary | ICD-10-CM

## 2019-05-02 DIAGNOSIS — O99320 Drug use complicating pregnancy, unspecified trimester: Secondary | ICD-10-CM

## 2019-05-02 DIAGNOSIS — O99333 Smoking (tobacco) complicating pregnancy, third trimester: Secondary | ICD-10-CM

## 2019-05-02 DIAGNOSIS — O365930 Maternal care for other known or suspected poor fetal growth, third trimester, not applicable or unspecified: Secondary | ICD-10-CM

## 2019-05-02 DIAGNOSIS — O99323 Drug use complicating pregnancy, third trimester: Secondary | ICD-10-CM | POA: Diagnosis not present

## 2019-05-02 DIAGNOSIS — O0993 Supervision of high risk pregnancy, unspecified, third trimester: Secondary | ICD-10-CM

## 2019-05-02 DIAGNOSIS — O99330 Smoking (tobacco) complicating pregnancy, unspecified trimester: Secondary | ICD-10-CM

## 2019-05-02 DIAGNOSIS — M549 Dorsalgia, unspecified: Secondary | ICD-10-CM

## 2019-05-02 DIAGNOSIS — F1120 Opioid dependence, uncomplicated: Secondary | ICD-10-CM

## 2019-05-02 DIAGNOSIS — Z8619 Personal history of other infectious and parasitic diseases: Secondary | ICD-10-CM

## 2019-05-02 NOTE — Telephone Encounter (Signed)
FMLA/DISABILITY form for Laurie Oliver filled out while pt waited, signature obtained, given to KT for processing.

## 2019-05-02 NOTE — Progress Notes (Signed)
  [facility] INDUCTION ASSESSMENT SCHEDULING Laurie Oliver 1986-04-10 Medical record #: [id number] Phone #:  Home Phone [phone]  Mobile [phone]    Prenatal Provider:[facility] Proposed admission date/time:8/17 0600 Method of induction:Cytotec  Weight: Filed Weights08/11/20 0946Weight:142 lb (64.4 kg) BMI Body mass index is 27.73 kg/m. HIV Negative HSV Negative EDC Estimated Date of Delivery: 8/24/20based on:US at [redacted] wks  Gestational age on admission: [redacted]w[redacted]d Gravidity/parity:G5P1021  Cervix Score   0 1 2 3   Position Posterior Midposition Anterior   Consistency Firm Medium Soft   Effacement (%) 0-30 40-50 60-70 >80  Dilation (cm) Closed 1-2 3-4 >5  Baby's station -3 -2 -1 +1, +2   Bishop Score:4   Medical induction of labor  select indication(s) below Elective induction ?39 weeks multiparous patient ?39 weeks primiparous patient with Bishop score ?7 ?40 weeks primiparous patient   Medical Indications Adapted from [facility] #[id number], "Medically Indicated Late Preterm and Early Term Deliveries," 2013.  PLACENTAL / UTERINE ISSUES FETAL ISSUES MATERNAL ISSUES  ? Placenta previa (36.0-37.6) ? Isoimmunization (37.0-38.6) ? Preeclampsia without severe features or gestational HTN (37.0)  ? Suspected accreta (34.0-35.6) ? Growth Restriction Nelda Marseille) ? Preeclampsia with severe features (34.0)  ? Prior classical CD, uterine window, rupture (36.0-37.6) ? Isolated (38.0-39.6) ? Chronic HTN (38.0-39.6)  ? Prior myomectomy (37.0-38.6) ? Concurrent findings (34.0-37.6) ? Cholestasis (37.0)  ? 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 Growth Restriction (Twins) ? Diabetes  ? Placental abruption (chronic) ? Di-Di Isolated (36.0-37.6) ? Pregestational, controlled (39.0)  OBSTETRIC ISSUES ? Di-Di concurrent findings (32.0-34.6) ? Pregestational, uncontrolled (37.0-39.0)  ? Postdates ? (41 weeks) ? Mo-Di isolated (32.0-34.6) ?  Pregestational, vascular compromise (37.0- 39.0)  ? PPROM (34.0) ? Multiple Gestation ? Gestational, diet controlled (40.0)  ? Hx of IUFD (39.0 weeks) ? Di-Di (38.0-38.6) ? Gestational, med controlled (39.0)  ? Polyhydramnios, mild/moderate; SDV 8-16 or AFI 25-35 (39.0) ? Mo-Di (36.0-37.6) ? Gestational, uncontrolled (38.0-39.0)  ? Oligohydramnios (36.0-37.6); MVP <2 cm  For indications not listed above, delivery recommendations from maternal-fetal medicine consultant occurred on: N/A  Provider Signature: Malachy Mood* Date:05/02/2019 12:52 PM   Call [phone] to finalize the induction date/time  [id number] (07/17)

## 2019-05-02 NOTE — Progress Notes (Signed)
ROB NST 

## 2019-05-02 NOTE — Addendum Note (Signed)
Addended by: Dorthula Nettles on: 05/02/2019 12:57 PM   Modules accepted: Orders, SmartSet

## 2019-05-02 NOTE — Progress Notes (Signed)
Obstetric H&P   Chief Complaint: Intrauterine growth restriction  Prenatal Care Provider: [facility]  History of Present Illness: 33 y.o. [id number] [redacted]w[redacted]d by 05/15/2019, by Ultrasound presenting today for NST and ROB.  Patient being followed for IUGR.  +FM, continued lumbago, irregular contractions, no vaginal bleeding, no LOF.  Pregravid weight 119 lb (54 kg) Total Weight Gain 23 lb (10.4 kg)  Pregnancy #3 Problems (from 09/09/18 to present)    Problem Noted Resolved   Back pain affecting pregnancy 02/22/2019 by Will Bonnet, MD No   Supervision of high risk pregnancy, antepartum 11/02/2018 by Homero Fellers, MD No   Overview Addendum 04/23/2019 11:27 AM by Dalia Heading, [place] Prenatal Labs  Dating  12 wk [place] Blood type: O/Positive/-- (02/12 1458)   Genetic Screen NIPS: normal XX   Antibody:Negative (02/12 1458)  Anatomic [place] Complete Rubella: 1.38 (02/12 1458) Varicella: Immune  GTT 129 RPR: Non Reactive (02/12 1458)   Rhogam N/A HBsAg: Negative (02/12 1458)   TDaP vaccine  03/22/2019            HIV: Non Reactive (02/12 1458)   Flu Shot  Declines                             GBS: negative  Contraception Depo Pap: 11/02/2018 NIL HPV negative  CBB     CS/VBAC    Baby Food Breast/bottle   Support Person             Smoking (tobacco) complicating pregnancy, unspecified trimester 11/02/2018 by Homero Fellers, MD No   Alcohol abuse complicating pregnancy 2/99/2426 by Homero Fellers, MD 04/21/2019 by Dalia Heading, CNM       Review of Systems: 10 point review of systems negative unless otherwise noted in HPI  Past Medical History: Past Medical History:  Diagnosis Date  . Alcohol use affecting pregnancy in first trimester 04/21/2019  . Bacterial vaginosis   . Bartholin's gland abscess   . Heart murmur   . Hepatitis C    treated-in remission    Past Surgical History: Past Surgical History:  Procedure Laterality Date  .  Barholins  gland abscess drainage      Past Obstetric History: # 1 - Date: None, Sex: None, Weight: None, GA: None, Delivery: None, Apgar1: None, Apgar5: None, Living: None, Birth Comments: None  # 2 - Date: None, Sex: None, Weight: None, GA: None, Delivery: None, Apgar1: None, Apgar5: None, Living: None, Birth Comments: None  # 3 - Date: None, Sex: None, Weight: None, GA: None, Delivery: None, Apgar1: None, Apgar5: None, Living: None, Birth Comments: None  # 4 - Date: 04/07/15, Sex: Female, Weight: 4 lb 14.7 oz (2.23 kg), GA: [redacted]w[redacted]d, Delivery: Vaginal, Spontaneous, Apgar1: 8, Apgar5: 9, Living: Living, Birth Comments: fetal growth restriction  # 5 - Date: None, Sex: None, Weight: None, GA: None, Delivery: None, Apgar1: None, Apgar5: None, Living: None, Birth Comments: None   Past Gynecologic History:  Family History: Family History  Problem Relation Age of Onset  . Ovarian cancer Mother   . Hypertension Maternal Grandmother   . Hypertension Maternal Grandfather   . Heart disease Maternal Grandfather   . Lung cancer Paternal Grandmother     Social History: Social History   Socioeconomic History  . Marital status: Married    Spouse name: Not on file  . Number of children: Not on file  . Years  of education: Not on file  . Highest education level: Not on file  Occupational History  . Not on file  Social Needs  . Financial resource strain: Not on file  . Food insecurity    Worry: Not on file    Inability: Not on file  . Transportation needs    Medical: Not on file    Non-medical: Not on file  Tobacco Use  . Smoking status: Current Every Day Smoker    Packs/day: 0.50    Types: Cigarettes  . Smokeless tobacco: Never Used  Substance and Sexual Activity  . Alcohol use: No    Comment: Subutex current, Hx of Cannabis and Opioid   . Drug use: Yes    Types: Other-see comments  . Sexual activity: Yes    Birth control/protection: None  Lifestyle  . Physical activity    Days per  week: Not on file    Minutes per session: Not on file  . Stress: Not on file  Relationships  . Social Musicianconnections    Talks on phone: Not on file    Gets together: Not on file    Attends religious service: Not on file    Active member of club or organization: Not on file    Attends meetings of clubs or organizations: Not on file    Relationship status: Not on file  . Intimate partner violence    Fear of current or ex partner: Not on file    Emotionally abused: Not on file    Physically abused: Not on file    Forced sexual activity: Not on file  Other Topics Concern  . Not on file  Social History Narrative  . Not on file    Medications: Prior to Admission medications   Medication Sig Start Date End Date Taking? Authorizing Provider  buprenorphine (SUBUTEX) 8 MG SUBL SL tablet Place 4 mg under the tongue 2 (two) times daily.    Yes [provider]  omeprazole (PRILOSEC) 20 MG capsule Take 1 capsule (20 mg total) by mouth daily. 11/22/18  Yes Vena AustriaStaebler, Lakendria Nicastro, MD  Prenat-Fe Poly-Methfol-FA-DHA (VITAFOL FE+) 90-0.6-0.4-200 MG CAPS Take 1 tablet by mouth daily. 11/02/18  Yes Schuman, Jaquelyn Bitterhristanna R, MD    Allergies: Allergies  Allergen Reactions  . Motrin [Ibuprofen] Nausea And Vomiting  . Aspirin Swelling  . Benzalkonium Chloride Swelling  . Neosporin [Neomycin-Bacitracin Zn-Polymyx] Swelling    Physical Exam: Vitals: Blood pressure 126/72, weight 142 lb (64.4 kg), last menstrual period 09/09/2018, unknown if currently breastfeeding.   FHT:140, moderate, +accels, no decels   General: NAD HEENT: normocephalic, anicteric Pulmonary: No increased work of breathing Cardiovascular: RRR, distal pulses 2+ Abdomen: Gravid, non-tender Leopolds: vtx Genitourinary: 1/50/-3 Extremities: no edema, erythema, or tenderness Neurologic: Grossly intact Psychiatric: mood appropriate, affect full  Labs: No results found for this or any previous visit (from the past 24 hour(s)).   Assessment: 33 y.o. [id number] 2882w1d by 05/15/2019, by Ultrasound routine prenatal IUGR  Plan: 1) IUGR - reassuring antenatal testing - reschedule BPP and doppler to this Friday - IOL at 39 week scheduled for 8/17 - COVID testing this Friday  2) Fetus - category I tracing/reactive NST  3) PNL - Blood type O/Positive/-- (02/12 1458) / Anti-bodyscreen Negative (02/12 1458) / Rubella 1.38 (02/12 1458) / Varicella Immune  / RPR Non Reactive (06/03 1523) / HBsAg Negative (02/12 1458) / HIV Non Reactive (06/03 1523) / 1-hr OGTT normal / GBS Negative (07/29 1452)  4) Immunization History -  Immunization History  Administered Date(s) Administered  . Tdap 03/22/2019    5) Disposition -  Return in about 3 days (around 05/05/2019) for ROB/Doppler/BPP/NST (move ultrasound from today to friday).   Vena AustriaAndreas Keashia Haskins, MD, Evern CoreFACOG [facility], [facility] 05/02/2019, 10:25 AM

## 2019-05-03 NOTE — Telephone Encounter (Signed)
Late entry:  New forms were filled out yesterday am, signed by AMS, copy given to pt; original will be given to KT when she is back in the office.

## 2019-05-08 ENCOUNTER — Other Ambulatory Visit: Payer: Self-pay

## 2019-05-08 ENCOUNTER — Inpatient Hospital Stay: Payer: Medicaid Other | Admitting: Certified Registered"

## 2019-05-08 ENCOUNTER — Inpatient Hospital Stay
Admission: EM | Admit: 2019-05-08 | Discharge: 2019-05-10 | DRG: 807 | Disposition: A | Discharge status: Home / Self Care | Payer: Medicaid Other | Attending: Advanced Practice Midwife | Admitting: Advanced Practice Midwife

## 2019-05-08 DIAGNOSIS — Z20828 Contact with and (suspected) exposure to other viral communicable diseases: Secondary | ICD-10-CM | POA: Diagnosis present

## 2019-05-08 DIAGNOSIS — O43123 Velamentous insertion of umbilical cord, third trimester: Secondary | ICD-10-CM | POA: Diagnosis present

## 2019-05-08 DIAGNOSIS — O365990 Maternal care for other known or suspected poor fetal growth, unspecified trimester, not applicable or unspecified: Secondary | ICD-10-CM | POA: Diagnosis present

## 2019-05-08 DIAGNOSIS — O9962 Diseases of the digestive system complicating childbirth: Secondary | ICD-10-CM | POA: Diagnosis present

## 2019-05-08 DIAGNOSIS — O365930 Maternal care for other known or suspected poor fetal growth, third trimester, not applicable or unspecified: Secondary | ICD-10-CM | POA: Diagnosis present

## 2019-05-08 DIAGNOSIS — Z3A39 39 weeks gestation of pregnancy: Secondary | ICD-10-CM

## 2019-05-08 DIAGNOSIS — K219 Gastro-esophageal reflux disease without esophagitis: Secondary | ICD-10-CM | POA: Diagnosis present

## 2019-05-08 DIAGNOSIS — O99334 Smoking (tobacco) complicating childbirth: Secondary | ICD-10-CM | POA: Diagnosis present

## 2019-05-08 LAB — CBC
HCT: 39.3 % (ref 36.0–46.0)
Hemoglobin: 13.3 g/dL (ref 12.0–15.0)
MCH: 32.1 pg (ref 26.0–34.0)
MCHC: 33.8 g/dL (ref 30.0–36.0)
MCV: 94.9 fL (ref 80.0–100.0)
Platelets: 140 10*3/uL — ABNORMAL LOW (ref 150–400)
RBC: 4.14 MIL/uL (ref 3.87–5.11)
RDW: 12.8 % (ref 11.5–15.5)
WBC: 7.4 10*3/uL (ref 4.0–10.5)
nRBC: 0 % (ref 0.0–0.2)

## 2019-05-08 LAB — URINE DRUG SCREEN, QUALITATIVE (ARMC ONLY)
Amphetamines, Ur Screen: NOT DETECTED
Barbiturates, Ur Screen: NOT DETECTED
Benzodiazepine, Ur Scrn: NOT DETECTED
Cannabinoid 50 Ng, Ur ~~LOC~~: NOT DETECTED
Cocaine Metabolite,Ur ~~LOC~~: NOT DETECTED
MDMA (Ecstasy)Ur Screen: NOT DETECTED
Methadone Scn, Ur: NOT DETECTED
Opiate, Ur Screen: NOT DETECTED
Phencyclidine (PCP) Ur S: NOT DETECTED
Tricyclic, Ur Screen: NOT DETECTED

## 2019-05-08 LAB — TYPE AND SCREEN
ABO/RH(D): O POS
Antibody Screen: NEGATIVE

## 2019-05-08 LAB — SARS CORONAVIRUS 2 BY RT PCR (HOSPITAL ORDER, PERFORMED IN ~~LOC~~ HOSPITAL LAB): SARS Coronavirus 2: NEGATIVE

## 2019-05-08 MED ORDER — FENTANYL 2.5 MCG/ML W/ROPIVACAINE 0.15% IN NS 100 ML EPIDURAL (ARMC)
EPIDURAL | Status: AC
  Filled 2019-05-08: qty 100

## 2019-05-08 MED ORDER — LACTATED RINGERS IV SOLN: 500.0000 mL | INTRAVENOUS | Status: DC | PRN

## 2019-05-08 MED ORDER — NICOTINE 14 MG/24HR TD PT24
14.0000 mg | MEDICATED_PATCH | Freq: Every day | TRANSDERMAL | Status: DC
  Administered 2019-05-08: 09:00:00 14 mg via TRANSDERMAL
  Filled 2019-05-08: qty 1

## 2019-05-08 MED ORDER — FENTANYL 2.5 MCG/ML W/ROPIVACAINE 0.15% IN NS 100 ML EPIDURAL (ARMC)
EPIDURAL | Status: DC | PRN
  Administered 2019-05-08: 12 mL/h via EPIDURAL

## 2019-05-08 MED ORDER — OXYTOCIN 40 UNITS IN NORMAL SALINE INFUSION - SIMPLE MED
1.0000 m[IU]/min | INTRAVENOUS | Status: DC
  Administered 2019-05-08: 13:00:00 2 m[IU]/min via INTRAVENOUS

## 2019-05-08 MED ORDER — ACETAMINOPHEN 325 MG PO TABS
650.0000 mg | ORAL_TABLET | ORAL | Status: DC | PRN
  Filled 2019-05-08: qty 2

## 2019-05-08 MED ORDER — BUPIVACAINE HCL (PF) 0.25 % IJ SOLN
INTRAMUSCULAR | Status: DC | PRN
  Administered 2019-05-08 (×2): 4 mL via EPIDURAL

## 2019-05-08 MED ORDER — LACTATED RINGERS IV SOLN
INTRAVENOUS | Status: DC
  Administered 2019-05-08 (×2): via INTRAVENOUS

## 2019-05-08 MED ORDER — TERBUTALINE SULFATE 1 MG/ML IJ SOLN: 0.2500 mg | Freq: Once | INTRAMUSCULAR | Status: DC | PRN

## 2019-05-08 MED ORDER — OXYTOCIN BOLUS FROM INFUSION
500.0000 mL | Freq: Once | INTRAVENOUS | Status: AC
  Administered 2019-05-08: 500 mL via INTRAVENOUS

## 2019-05-08 MED ORDER — MISOPROSTOL 100 MCG PO TABS
25.0000 ug | ORAL_TABLET | ORAL | Status: DC | PRN
  Administered 2019-05-08: 08:00:00 25 ug via VAGINAL
  Filled 2019-05-08 (×2): qty 1

## 2019-05-08 MED ORDER — SOD CITRATE-CITRIC ACID 500-334 MG/5ML PO SOLN: 30.0000 mL | ORAL | Status: DC | PRN

## 2019-05-08 MED ORDER — LIDOCAINE-EPINEPHRINE (PF) 1.5 %-1:200000 IJ SOLN
INTRAMUSCULAR | Status: DC | PRN
  Administered 2019-05-08: 3 mL via EPIDURAL

## 2019-05-08 MED ORDER — LIDOCAINE HCL (PF) 1 % IJ SOLN: 30.0000 mL | INTRAMUSCULAR | Status: DC | PRN

## 2019-05-08 MED ORDER — OXYTOCIN 40 UNITS IN NORMAL SALINE INFUSION - SIMPLE MED
2.5000 [IU]/h | INTRAVENOUS | Status: DC
  Filled 2019-05-08: qty 1000

## 2019-05-08 MED ORDER — ONDANSETRON HCL 4 MG/2ML IJ SOLN: 4.0000 mg | Freq: Four times a day (QID) | INTRAMUSCULAR | Status: DC | PRN

## 2019-05-08 MED ORDER — ACETAMINOPHEN 325 MG PO TABS
650.0000 mg | ORAL_TABLET | ORAL | Status: DC | PRN
  Administered 2019-05-09 – 2019-05-10 (×7): 650 mg via ORAL
  Filled 2019-05-08 (×6): qty 2

## 2019-05-08 MED ORDER — LIDOCAINE HCL (PF) 1 % IJ SOLN
INTRAMUSCULAR | Status: DC | PRN
  Administered 2019-05-08: 3 mL via INTRADERMAL

## 2019-05-08 NOTE — Anesthesia Procedure Notes (Signed)
Epidural Patient location during procedure: OB  Staffing Anesthesiologist: Emmie Niemann, MD Resident/CRNA: Rolla Plate, CRNA Performed: resident/CRNA   Preanesthetic Checklist Completed: patient identified, site marked, surgical consent, pre-op evaluation, timeout performed, IV checked, risks and benefits discussed and monitors and equipment checked  Epidural Patient position: sitting Prep: ChloraPrep and site prepped and draped Patient monitoring: heart rate, continuous pulse ox and blood pressure Approach: midline Location: L3-L4 Injection technique: LOR saline  Needle:  Needle type: Tuohy  Needle gauge: 17 G Needle length: 9 cm and 9 Needle insertion depth: 6 cm Catheter type: closed end flexible Catheter size: 19 Gauge Catheter at skin depth: 11 cm Test dose: negative and 1.5% lidocaine with Epi 1:200 K  Assessment Events: blood not aspirated, injection not painful, no injection resistance, negative IV test and no paresthesia  Additional Notes   Patient tolerated the insertion well without complications.Reason for block:procedure for pain

## 2019-05-08 NOTE — Progress Notes (Signed)
  Labor Progress Note   33 y.o. [id number] @ [redacted]w[redacted]d , admitted for  Pregnancy, Labor Management. IOL  Subjective:  Patient is comfortable now with epidural.   Objective:  BP 121/69   Pulse 92   Temp 98 F (36.7 C) (Oral)   Resp 18   Ht 5' (1.524 m)   Wt 64.4 kg   LMP 09/09/2018 (Within Days)   SpO2 99%   BMI 27.73 kg/m  Abd: gravid, ND, FHT present, mild tenderness on exam Extr: no edema SVE: per RN, 3/70/-2  EFM: FHR: 120 bpm, variability: moderate,  accelerations:  Present,  decelerations:  Absent Toco: Frequency: Every 2-3 minutes Labs: I have reviewed the patient's lab results.   Assessment & Plan:  [id number] @ [redacted]w[redacted]d, admitted for  Pregnancy and Labor/Delivery Management  1. Pain management: epidural. 2. FWB: FHT category I.  3. ID: GBS negative 4. Labor management: pitocin  All discussed with patient, see orders   Rod Can, [facility] 05/08/2019  4:09 PM

## 2019-05-08 NOTE — Anesthesia Preprocedure Evaluation (Signed)
Anesthesia Evaluation  Patient identified by MRN, date of birth, ID band Patient awake    Reviewed: Allergy & Precautions, H&P , NPO status , Patient's Chart, lab work & pertinent test results  Airway Mallampati: III  TM Distance: >3 FB Neck ROM: full    Dental  (+) Dental Advidsory Given   Pulmonary Current Smoker and Patient abstained from smoking.,           Cardiovascular + Valvular Problems/Murmurs      Neuro/Psych  Headaches, Anxiety    GI/Hepatic GERD  ,(+) Hepatitis -  Endo/Other  negative endocrine ROS  Renal/GU negative Renal ROS  negative genitourinary   Musculoskeletal   Abdominal   Peds  Hematology negative hematology ROS (+)   Anesthesia Other Findings   Reproductive/Obstetrics (+) Pregnancy                             Anesthesia Physical Anesthesia Plan  ASA: II  Anesthesia Plan: Epidural   Post-op Pain Management:    Induction:   PONV Risk Score and Plan:   Airway Management Planned:   Additional Equipment:   Intra-op Plan:   Post-operative Plan:   Informed Consent: I have reviewed the patients History and Physical, chart, labs and discussed the procedure including the risks, benefits and alternatives for the proposed anesthesia with the patient or authorized representative who has indicated his/her understanding and acceptance.       Plan Discussed with: Anesthesiologist and CRNA  Anesthesia Plan Comments:         Anesthesia Quick Evaluation

## 2019-05-08 NOTE — Discharge Summary (Signed)
OB Discharge Summary     Patient Name: Laurie Oliver DOB: [birth year] MRN: [id number]  Date of admission: 05/08/2019 Delivering provider: Rod Can, CNM  Date of Delivery: 05/08/2019  Date of discharge: 05/10/2019  Admitting diagnosis: induction  Intrauterine pregnancy: [redacted]w[redacted]d     Secondary diagnosis: intrauterine growth restriction     Discharge diagnosis: Term Pregnancy Delivered                                                                                                Post partum procedures: None  Augmentation: AROM, Pitocin and Cytotec  Complications: None  Hospital course:  Induction of Labor With Vaginal Delivery   33 y.o. yo [id number] at [redacted]w[redacted]d was admitted to the hospital 05/08/2019 for induction of labor.  Indication for induction: intrauterine growth restriction.  Patient had an uncomplicated labor course as follows: Membrane Rupture Time/Date: 5:49 PM ,05/08/2019   Patient had delivery of viable female 9:12 PM, 05/08/2019  Details of delivery can be found in separate delivery note.    Patient had a routine postpartum course.  Patient is discharged home 05/10/19.  Physical exam  Vitals:   05/09/19 1111 05/09/19 1558 05/09/19 2331 05/10/19 0847  BP: 118/88 109/76 113/63 124/88  Pulse: 83 66 66 67  Resp: 18 18 18 20   Temp: 98.3 F (36.8 C) 98.3 F (36.8 C) 97.8 F (36.6 C) 98.1 F (36.7 C)  TempSrc: Oral Oral Oral Oral  SpO2: 100% 99% 100% 100%  Weight:      Height:       General: alert, cooperative and no distress Lochia: appropriate Uterine Fundus: firm Incision: N/A DVT Evaluation: No evidence of DVT seen on physical exam.  Labs: Lab Results  Component Value Date   WBC 7.8 05/09/2019   HGB 11.8 (L) 05/09/2019   HCT 34.3 (L) 05/09/2019   MCV 93.0 05/09/2019   PLT 114 (L) 05/09/2019    Discharge instruction: per After Visit Summary.  Medications:  Allergies as of 05/10/2019      Reactions   Motrin [ibuprofen] Nausea And Vomiting   Aspirin  Swelling   Benzalkonium Chloride Swelling   Neosporin [neomycin-bacitracin Zn-polymyx] Swelling      Medication List    TAKE these medications   buprenorphine 8 MG Subl SL tablet Commonly known as: SUBUTEX Place 4 mg under the tongue 2 (two) times daily.   omeprazole 20 MG capsule Commonly known as: PRILOSEC Take 1 capsule (20 mg total) by mouth daily.   Vitafol FE+ 90-0.6-0.4-200 MG Caps Take 1 tablet by mouth daily.       Diet: routine diet  Activity: Advance as tolerated. Pelvic rest for 6 weeks.   Outpatient follow up: [place]. Go in 6 week(s).   Specialty: Obstetrics and Gynecology Why: postpartum follow up visit Contact information: [address] [place] [address] [phone]            Postpartum contraception: Depo Provera Rhogam Given postpartum: NA Rubella vaccine given postpartum: Rubella Immune Varicella vaccine given postpartum: Varicella Immune TDaP given antepartum or postpartum: given antepartum  Newborn Data: Live born female Zara Birth Weight:  2580 g, 5 pounds 11 ounces APGAR: 9, 9  Newborn Delivery   Birth date/time: 05/08/2019 21:12:00 Delivery type: Vaginal, Spontaneous       Baby Feeding: breast and formula  Disposition: Baby on Eat, Sleep, Console program, mom will room-in  SIGNED:  Oswaldo ConroyJacelyn Y Virjean Boman, CNM 05/10/2019 9:07 AM

## 2019-05-08 NOTE — Progress Notes (Signed)
  Labor Progress Note   33 y.o. [id number] @ [redacted]w[redacted]d , admitted for  Pregnancy, Labor Management. IOL for IUGR  Subjective:  Comfortable with epidural.   Objective:  BP (!) 92/43   Pulse (!) 52   Temp 98 F (36.7 C) (Oral)   Resp 18   Ht 5' (1.524 m)   Wt 64.4 kg   LMP 09/09/2018 (Within Days)   SpO2 99%   BMI 27.73 kg/m  Abd: gravid, ND, FHT present, mild tenderness on exam Extr: no edema SVE: CERVIX: 4.5 cm dilated, 70-80 effaced, -2 station AROM: moderate amount clear fluid, cervical sweep  EFM: FHR: 140 bpm, variability: moderate,  accelerations:  Present,  decelerations:  Absent Toco: Frequency: Every 2-3 minutes Labs: I have reviewed the patient's lab results.   Assessment & Plan:  [id number] @ [redacted]w[redacted]d, admitted for  Pregnancy and Labor/Delivery Management  1. Pain management: epidural. 2. FWB: FHT category I.  3. ID: GBS negative 4. Labor management: continue pitocin  All discussed with patient, see orders   Rod Can, [facility] 05/08/2019  5:55 PM

## 2019-05-09 LAB — CBC
HCT: 34.3 % — ABNORMAL LOW (ref 36.0–46.0)
Hemoglobin: 11.8 g/dL — ABNORMAL LOW (ref 12.0–15.0)
MCH: 32 pg (ref 26.0–34.0)
MCHC: 34.4 g/dL (ref 30.0–36.0)
MCV: 93 fL (ref 80.0–100.0)
Platelets: 114 10*3/uL — ABNORMAL LOW (ref 150–400)
RBC: 3.69 MIL/uL — ABNORMAL LOW (ref 3.87–5.11)
RDW: 12.7 % (ref 11.5–15.5)
WBC: 7.8 10*3/uL (ref 4.0–10.5)
nRBC: 0 % (ref 0.0–0.2)

## 2019-05-09 LAB — RPR: RPR Ser Ql: NONREACTIVE

## 2019-05-09 MED ORDER — DIPHENHYDRAMINE HCL 25 MG PO CAPS: 25.0000 mg | ORAL_CAPSULE | Freq: Four times a day (QID) | ORAL | Status: DC | PRN

## 2019-05-09 MED ORDER — BUPRENORPHINE HCL 2 MG SL SUBL
8.0000 mg | SUBLINGUAL_TABLET | Freq: Two times a day (BID) | SUBLINGUAL | Status: DC
  Administered 2019-05-09 – 2019-05-10 (×4): 8 mg via SUBLINGUAL
  Filled 2019-05-09 (×4): qty 4

## 2019-05-09 MED ORDER — BENZOCAINE-MENTHOL 20-0.5 % EX AERO: 1.0000 "application " | INHALATION_SPRAY | CUTANEOUS | Status: DC | PRN

## 2019-05-09 MED ORDER — SENNOSIDES-DOCUSATE SODIUM 8.6-50 MG PO TABS: 2.0000 | ORAL_TABLET | ORAL | Status: DC

## 2019-05-09 MED ORDER — SIMETHICONE 80 MG PO CHEW: 80.0000 mg | CHEWABLE_TABLET | ORAL | Status: DC | PRN

## 2019-05-09 MED ORDER — ONDANSETRON HCL 4 MG PO TABS: 4.0000 mg | ORAL_TABLET | ORAL | Status: DC | PRN

## 2019-05-09 MED ORDER — ONDANSETRON HCL 4 MG/2ML IJ SOLN: 4.0000 mg | INTRAMUSCULAR | Status: DC | PRN

## 2019-05-09 MED ORDER — SENNOSIDES-DOCUSATE SODIUM 8.6-50 MG PO TABS
2.0000 | ORAL_TABLET | ORAL | Status: DC
  Administered 2019-05-09 – 2019-05-10 (×2): 2 via ORAL
  Filled 2019-05-09 (×2): qty 2

## 2019-05-09 MED ORDER — PANTOPRAZOLE SODIUM 40 MG PO TBEC
40.0000 mg | DELAYED_RELEASE_TABLET | Freq: Every day | ORAL | Status: DC
  Administered 2019-05-10: 10:00:00 40 mg via ORAL
  Filled 2019-05-09: qty 1

## 2019-05-09 MED ORDER — NICOTINE 14 MG/24HR TD PT24
14.0000 mg | MEDICATED_PATCH | Freq: Every day | TRANSDERMAL | Status: DC
  Administered 2019-05-09 – 2019-05-10 (×2): 14 mg via TRANSDERMAL
  Filled 2019-05-09 (×2): qty 1

## 2019-05-09 MED ORDER — COCONUT OIL OIL
1.0000 "application " | TOPICAL_OIL | Status: DC | PRN
  Administered 2019-05-09: 1 via TOPICAL
  Filled 2019-05-09: qty 120

## 2019-05-09 MED ORDER — PRENATAL MULTIVITAMIN CH
1.0000 | ORAL_TABLET | Freq: Every day | ORAL | Status: DC
  Administered 2019-05-09 – 2019-05-10 (×2): 1 via ORAL
  Filled 2019-05-09 (×2): qty 1

## 2019-05-09 NOTE — Lactation Note (Signed)
This note was copied from a baby's chart. Lactation Consultation Note  Patient Name: Laurie Oliver [id number] Date: 05/09/2019   [facility] spoke briefly with parents. Mom was currently breastfeeding when LC stepped into the room. Mom reports having had breastfed 20 minutes on right side, and was finishing up a feed on the left breast after almost 25 minutes. Mom was sitting up in the bed without back support and was leaned over the baby, when asked about her being comfortable, she reported her back hurt. FOB was laying in bed, asleep- not giving room for mom to be comfortable. Encouraged mom to find a comfortable position with use of pillows and back support to aide in her being comfortable while breastfeeding. Mom reports this was the first breastfeeding baby has done since after delivery; other feeds had been formula. [name]'s plan is to breast feed, formula feed, and pump. Provided education to mom on the benefits of feeding at the breast, ongoing of frequent skin to skin, before adding in an additional responsibility of pumping. LC will return with breast pump kit and provide education for set up and use. Reviewed with mom infant hunger and feeding cues, newborn stomach size, transition of breast milk, and wet/stool diapers. Encouraged feeding on cue, beginning with offering the breast, and then provide formula per MD direction. [facility] name and contact number written on white board, mom encouraged to call out for any questions or assistance with breastfeeding.    Maternal Data    Feeding Feeding Type: Breast Fed Nipple Type: Slow - flow  LATCH Score Latch: Repeated attempts needed to sustain latch, nipple held in mouth throughout feeding, stimulation needed to elicit sucking reflex.  Audible Swallowing: A few with stimulation  Type of Nipple: Everted at rest and after stimulation  Comfort (Breast/Nipple): Soft / non-tender  Hold (Positioning): Assistance needed to correctly position infant at  breast and maintain latch.  LATCH Score: 7  Interventions    Lactation Tools Discussed/Used     Consult Status Consult Status: Follow-up Date: 05/09/19 Follow-up type: In-patient    Lavonia Drafts 05/09/2019, 9:48 AM

## 2019-05-09 NOTE — Progress Notes (Signed)
Post Partum Day 1 Subjective: up ad lib, voiding and tolerating PO  Objective: Blood pressure 118/88, pulse 83, temperature 98.3 F (36.8 C), temperature source Oral, resp. rate 18, height 5' (1.524 m), weight 64.4 kg, last menstrual period 09/09/2018, SpO2 100 %, unknown if currently breastfeeding.  Physical Exam:  General: alert, cooperative and no distress Lochia: appropriate Uterine Fundus: firm at U per RN exam  DVT Evaluation: No evidence of DVT seen on physical exam.  Recent Labs    05/08/19 0744 05/09/19 0459  HGB 13.3 11.8*  HCT 39.3 34.3*  WBC 7.4 7.8  PLT 140* 114*    Assessment/Plan: Stable PPD #1-continue postpartum care Pantoprazole for GERD Nicotine patch O POS/ RI/ VI TDAP-given AP Breast and bottle/ Depo Discharge planned for tomorrow Baby will need to stay for observation for NAS   LOS: 1 day   Dalia Heading 05/09/2019, 11:49 AM

## 2019-05-09 NOTE — Anesthesia Postprocedure Evaluation (Signed)
Anesthesia Post Note  Patient: Laurie Oliver  Procedure(s) Performed: AN AD [place]  Patient location during evaluation: Mother Baby Anesthesia Type: Epidural Level of consciousness: awake and alert Pain management: pain level controlled Vital Signs Assessment: post-procedure vital signs reviewed and stable Respiratory status: spontaneous breathing, nonlabored ventilation and respiratory function stable Cardiovascular status: stable Postop Assessment: no headache, epidural receding, patient able to bend at knees and able to ambulate Anesthetic complications: no     Last Vitals:  Vitals:   05/09/19 0403 05/09/19 0757  BP: 123/85 119/71  Pulse: (!) 58 64  Resp:  18  Temp:  36.9 C  SpO2:  100%    Last Pain:  Vitals:   05/09/19 0757  TempSrc: Oral  PainSc:                  [name]

## 2019-05-10 MED ORDER — MEDROXYPROGESTERONE ACETATE 150 MG/ML IM SUSP
150.0000 mg | Freq: Once | INTRAMUSCULAR | Status: AC
  Administered 2019-05-10: 17:00:00 150 mg via INTRAMUSCULAR
  Filled 2019-05-10: qty 1

## 2019-05-10 NOTE — Lactation Note (Signed)
This note was copied from a baby's chart. Lactation Consultation Note  Patient Name: Laurie Oliver [id number] Date: 05/10/2019 Reason for consult: Follow-up assessment  LC spoke with mom this morning. Mom continues to report breastfeeding to be going well. Infant being fed on hunger cue demand. LC notes that baby was in cradle position, supported by pillow with tummy turned up and head turned towards breast.  Spoke with mom about conformability of position, and tips for turning baby's tummy in towards mom so baby can have a more effective latch and milk transfer. Assisted with helping mom turn baby in. Mom reports having used the pump kit that was set out yesterday, this morning in an effort to help encourage higher milk output.  Reviewed with mom set-up with the parts/pieces, cleaning, and milk storage. Helped mom to fit the flanges on the breast to the right size, and encouraged use of coconut milk when pumping. Mom plans to continue putting baby to the breast for most feeds, but will opt to pump 1-2 feeds/day and give by bottle if possible.  [facility] name and number written on whiteboard, encouraged to call out with any questions and/or concerns.   Maternal Data Has patient been taught Hand Expression?: Yes Does the patient have breastfeeding experience prior to this delivery?: Yes  Feeding Feeding Type: Breast Fed  LATCH Score Latch: Grasps breast easily, tongue down, lips flanged, rhythmical sucking.  Audible Swallowing: A few with stimulation  Type of Nipple: Everted at rest and after stimulation  Comfort (Breast/Nipple): Soft / non-tender  Hold (Positioning): Assistance needed to correctly position infant at breast and maintain latch.  LATCH Score: 8  Interventions Interventions: Adjust position;Support pillows;DEBP  Lactation Tools Discussed/Used Pump Review: Milk Storage;Setup, frequency, and cleaning Initiated by:: Gerome Sam, MPH, IBCLC Date initiated:: 05/09/19(pump  kit given yesterday; shown how to use- mom started toda)   Consult Status Consult Status: Follow-up Date: 05/10/19 Follow-up type: In-patient    Lavonia Drafts 05/10/2019, 9:42 AM

## 2019-05-10 NOTE — Progress Notes (Signed)
Pt discharged, infant is staying for ESC.  Discharge instructions and follow up appointment given to and reviewed with pt. Pt verbalized understanding. Pt and support person will stay in the same room when infant is transferred to Peds.  Pt to be taken out of computer after her 2000 dose of subutex.

## 2019-05-11 ENCOUNTER — Ambulatory Visit: Payer: Self-pay

## 2019-05-11 NOTE — Lactation Note (Signed)
This note was copied from a baby's chart. Lactation Consultation Note  Patient Name: Laurie Oliver Date: 05/11/2019   [facility] spoke with mom this morning. Mom reports baby has continued to breastfeed well overnight. Mom is starting to feel some fullness in her breasts, and plans to possibly pump some today for relief.  Mom's feeding goal is to continue to offer both breast/breastmilk and formula to infant.  Discussed breast compression and massage while baby is breastfeeding to help keep infant alert during feeds, and assist with milk transfer while feeding; noting if there is a difference in how moms breasts feel before and after. Reviewed position, bringing infant to breast instead of breast to infant, alignment of infant with tummy turned in, wide open mouth and flange lips. Discussed how to look and listen for swallows while breastfeeding, and signs of fullness in infant. Educated mom on breast care and management of breast fullness and engorgement throughout the next few days. Encouraged to continue to call out to [facility] for additional support as needed.  Maternal Data    Feeding Feeding Type: Breast Fed  [facility] Score                   Interventions    Lactation Tools Discussed/Used     Consult Status      Laurie Oliver 05/11/2019, 11:15 AM

## 2019-07-14 ENCOUNTER — Telehealth: Payer: Self-pay | Admitting: Advanced Practice Midwife

## 2019-07-14 NOTE — Telephone Encounter (Signed)
Called and left message for patient to call back to be schedule for 6 week PP. Marolyn Hammock is unable to fill out return to work information for [employer] until patient is seen.

## 2019-07-18 NOTE — Telephone Encounter (Signed)
Patient is schedule for 08/11/19 with Opal Sidles

## 2019-08-11 ENCOUNTER — Ambulatory Visit (INDEPENDENT_AMBULATORY_CARE_PROVIDER_SITE_OTHER): Payer: Medicaid Other | Admitting: Advanced Practice Midwife

## 2019-08-11 ENCOUNTER — Encounter: Payer: Self-pay | Admitting: Advanced Practice Midwife

## 2019-08-11 ENCOUNTER — Other Ambulatory Visit: Payer: Self-pay

## 2019-08-11 VITALS — BP 116/64 | HR 96 | Wt 123.0 lb

## 2019-08-11 DIAGNOSIS — Z1389 Encounter for screening for other disorder: Secondary | ICD-10-CM | POA: Diagnosis not present

## 2019-08-11 DIAGNOSIS — Z3202 Encounter for pregnancy test, result negative: Secondary | ICD-10-CM

## 2019-08-11 DIAGNOSIS — N912 Amenorrhea, unspecified: Secondary | ICD-10-CM

## 2019-08-11 DIAGNOSIS — Z3042 Encounter for surveillance of injectable contraceptive: Secondary | ICD-10-CM

## 2019-08-11 LAB — POCT URINE PREGNANCY: Preg Test, Ur: NEGATIVE

## 2019-08-11 MED ORDER — MEDROXYPROGESTERONE ACETATE 150 MG/ML IM SUSP: 150.0000 mg | INTRAMUSCULAR | 3 refills | Status: DC

## 2019-08-11 NOTE — Progress Notes (Signed)
Postpartum Visit  Date of Service: 08/11/2019  Chief Complaint:  Chief Complaint  Patient presents with  . Postpartum Care    Vaginal delivery 05/08/19/[facility]    History of Present Illness: Patient is a 33 y.o. [id number] presents for postpartum visit.  Review the Delivery Report for details.  Date of delivery: 05/08/2019 Type of delivery: Vaginal delivery - Vacuum or forceps assisted  no Episiotomy No.  Laceration: no  Pregnancy or labor problems:  Subutex maintenance, tobacco use, IUGR  Any problems since the delivery:  Vaginal prolapse concerns. She has been evaluated for prolapse 1 year ago and first mentioned concerns about 3 years ago. She reports something protruding from her vagina when she is upright and straining. She has had pelvic PT without significant improvement. She has previously been evaluated by Dr Bonney AidStaebler who found no evidence of prolapse. He discussed possible treatments for vaginal laxity. Please see office visit note from 12/30/2017.  Newborn Details:  SINGLETON :  1. BabyGender female. Birth weight: 5 pounds 11 ounces Maternal Details:  Breast or formula feeding: breastfeeding/formula Intercourse: Yes Contraception after delivery: Depo- 1st injection at Postpartum discharge Any bowel or bladder issues: ongoing constipation Post partum depression/anxiety noted:  no Edinburgh Post-Partum Depression Score: 5 Date of last PAP: 11/02/2018  no abnormalities   Review of Systems: Review of Systems  Constitutional: Negative.   HENT: Negative.   Eyes: Negative.   Respiratory: Negative.   Cardiovascular: Negative.   Gastrointestinal: Positive for constipation.  Genitourinary: Negative.   Musculoskeletal: Negative.   Skin: Negative.   Neurological: Negative.   Endo/Heme/Allergies: Negative.   Psychiatric/Behavioral: Negative.     Past Medical History:  Past Medical History:  Diagnosis Date  . 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 Bacterial vaginosis   . Bartholin's gland abscess   . Heart murmur   . Hepatitis C    treated-in remission    Past Surgical History:  Past Surgical History:  Procedure Laterality Date  .  Barholins gland abscess drainage      Family History:  Family History  Problem Relation Age of Onset  . Ovarian cancer Mother   . Hypertension Maternal Grandmother   . Hypertension Maternal Grandfather   . Heart disease Maternal Grandfather   . Lung cancer Paternal Grandmother     Social History:  Social History   Socioeconomic History  . Marital status: Married    Spouse name: Not on file  . Number of children: Not on file  . Years of education: Not on file  . Highest education level: Not on file  Occupational History  . Not on file  Social Needs  . Financial resource strain: Not on file  . Food insecurity    Worry: Not on file    Inability: Not on file  . Transportation needs    Medical: Not on file    Non-medical: Not on file  Tobacco Use  . Smoking status: Current Every Day Smoker    Packs/day: 0.50    Types: Cigarettes  . Smokeless tobacco: Never Used  Substance and Sexual Activity  . Alcohol use: No  . Drug use: Yes    Types: Other-see comments    Comment: Subutex current, Hx of Cannabis and Opioid   . Sexual activity: Yes    Birth control/protection: Injection    Comment: depo  Lifestyle  . Physical activity    Days per week: Not on  file    Minutes per session: Not on file  . Stress: Not on file  Relationships  . Social Musician on phone: Not on file    Gets together: Not on file    Attends religious service: Not on file    Active member of club or organization: Not on file    Attends meetings of clubs or organizations: Not on file    Relationship status: Not on file  . Intimate partner violence    Fear of current or ex partner: Not on file    Emotionally abused: Not on file    Physically abused: Not  on file    Forced sexual activity: Not on file  Other Topics Concern  . Not on file  Social History Narrative  . Not on file    Allergies:  Allergies  Allergen Reactions  . Motrin [Ibuprofen] Nausea And Vomiting  . Aspirin Swelling  . Benzalkonium Chloride Swelling  . Neosporin [Neomycin-Bacitracin Zn-Polymyx] Swelling    Medications: Prior to Admission medications   Medication Sig Start Date End Date Taking? Authorizing Provider  buprenorphine (SUBUTEX) 8 MG SUBL SL tablet Place 4 mg under the tongue 2 (two) times daily.     [provider]  medroxyPROGESTERone (DEPO-PROVERA) 150 MG/ML injection Inject 1 mL (150 mg total) into the muscle every 3 (three) months. 08/11/19 11/09/19  Tresea Mall, CNM  omeprazole (PRILOSEC) 20 MG capsule Take 1 capsule (20 mg total) by mouth daily. 11/22/18   Vena Austria, MD  Prenat-Fe Poly-Methfol-FA-DHA (VITAFOL FE+) 90-0.6-0.4-200 MG CAPS Take 1 tablet by mouth daily. 11/02/18   Natale Milch, MD    Physical Exam Blood pressure 116/64, pulse 96, weight 123 lb (55.8 kg), currently breastfeeding.    General: NAD HEENT: normocephalic, anicteric Pulmonary: No increased work of breathing Abdomen: NABS, soft, non-tender, non-distended.  Umbilicus without lesions.  No hepatomegaly, splenomegaly or masses palpable. No evidence of hernia. Genitourinary:  External: Normal external female genitalia.  Normal urethral meatus, normal  Bartholin's and Skene's glands.    Vagina: Normal vaginal mucosa, no evidence of prolapse, decreased muscle strength with kegel  Cervix: Grossly normal in appearance, no bleeding, no CMT  Uterus: Non-enlarged, mobile, normal contour.    Adnexa: ovaries non-enlarged, no adnexal masses  Rectal: deferred Extremities: no edema, erythema, or tenderness Neurologic: Grossly intact Psychiatric: mood appropriate, affect full  Edinburgh Postnatal Depression Scale - 08/11/19 1422      Edinburgh Postnatal  Depression Scale:  In the Past 7 Days   I have been able to laugh and see the funny side of things.  0    I have looked forward with enjoyment to things.  0    I have blamed myself unnecessarily when things went wrong.  2    I have been anxious or worried for no good reason.  0    I have felt scared or panicky for no good reason.  1    Things have been getting on top of me.  0    I have been so unhappy that I have had difficulty sleeping.  1    I have felt sad or miserable.  0    I have been so unhappy that I have been crying.  1    The thought of harming myself has occurred to me.  0   Edinburgh Postnatal Depression Scale Total  5       Assessment: 33 y.o. [id number] presenting for 6 week postpartum  visit  Plan: Problem List Items Addressed This Visit      Other   Postpartum care following vaginal delivery    Other Visit Diagnoses    Amenorrhea due to Depo Provera    -  Primary   Relevant Orders   POCT urine pregnancy (Completed)   Encounter for surveillance of injectable contraceptive       Relevant Medications   medroxyPROGESTERone (DEPO-PROVERA) 150 MG/ML injection       1) Contraception - Education given regarding options for contraception, as well as compatibility with breast feeding if applicable.  Patient plans on Depo-Provera injections for contraception.  2)  Pap - ASCCP guidelines and rational discussed.  ASCCP guidelines and rationale discussed.  Patient opts for every 3 years screening interval  3) Patient underwent screening for postpartum depression with no signs of depression  4) Return in about 1 year (around 08/10/2020) for gyn visit for prolapse with AMS and annual established gyn.   Rod Can, [facility] 08/11/2019, 2:55 PM

## 2019-08-28 ENCOUNTER — Encounter: Payer: Self-pay | Admitting: Advanced Practice Midwife

## 2019-08-28 ENCOUNTER — Telehealth: Payer: Self-pay

## 2019-08-28 NOTE — Telephone Encounter (Signed)
Please fax letter to [phone]

## 2019-08-28 NOTE — Telephone Encounter (Signed)
Pt calling; also called after hour nurse 08/25/19 at 8:07pm stating she needs a note from [place] to [facility] at [phone] to tell them she is still breastfeeding so she can get her Subtex 8mg  two times a day.  Last dose was 12/4; no current sxs.  Was told to call office this am and to call after hour nurse back if sxs develop.

## 2019-08-29 ENCOUNTER — Ambulatory Visit: Payer: Medicaid Other | Admitting: Obstetrics and Gynecology

## 2019-08-30 NOTE — Telephone Encounter (Signed)
Letter faxed 08/28/19.

## 2019-08-31 NOTE — Telephone Encounter (Signed)
Left detailed msg.

## 2019-09-20 ENCOUNTER — Ambulatory Visit: Payer: Medicaid Other | Admitting: Obstetrics and Gynecology

## 2020-01-12 IMAGING — US US ABDOMEN LIMITED
1 series · 14 of 25 positions shown · non-contrast
Comparison: None.

CLINICAL DATA: Right upper quadrant pain

EXAM:
ULTRASOUND ABDOMEN LIMITED RIGHT UPPER QUADRANT

[Series 1: us abdomen limited · 0.20mm/px · 14 of 51 slices shown]
[im 1/51]
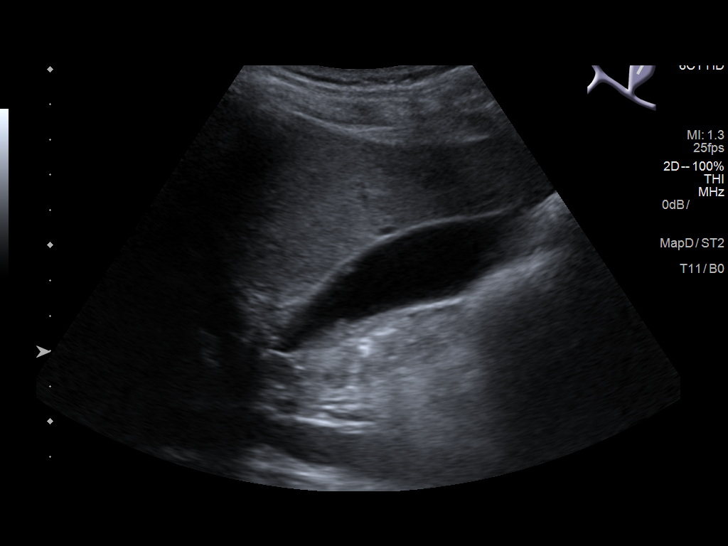
[im 5/51]
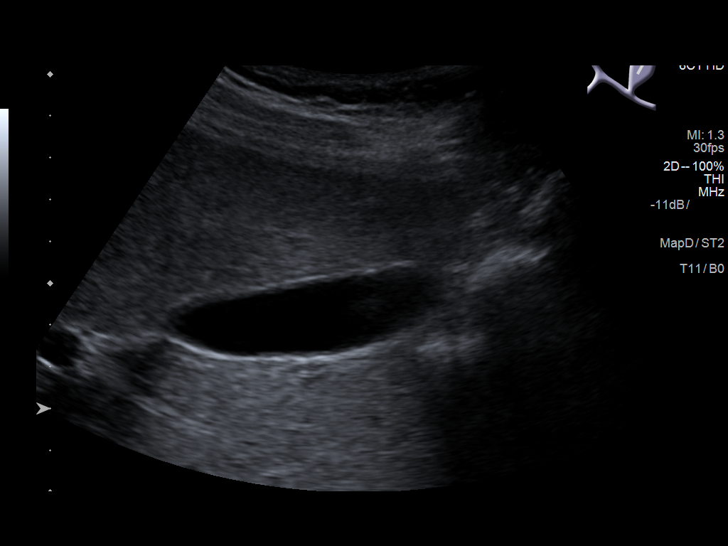
[im 9/51]
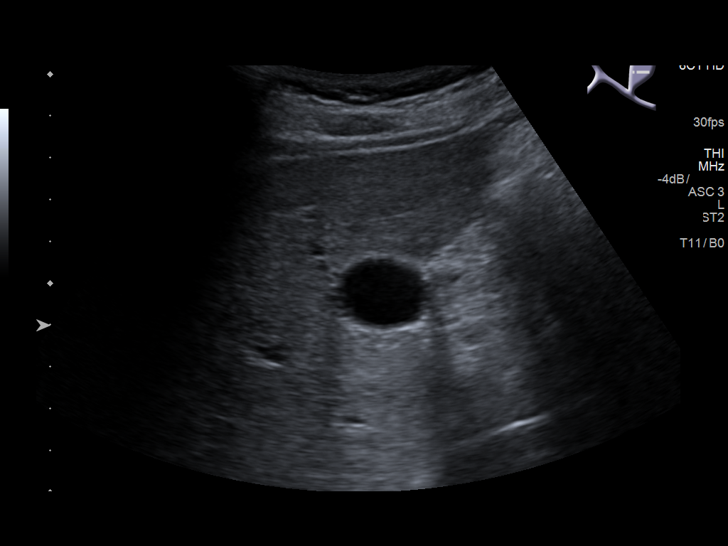
[im 13/51]
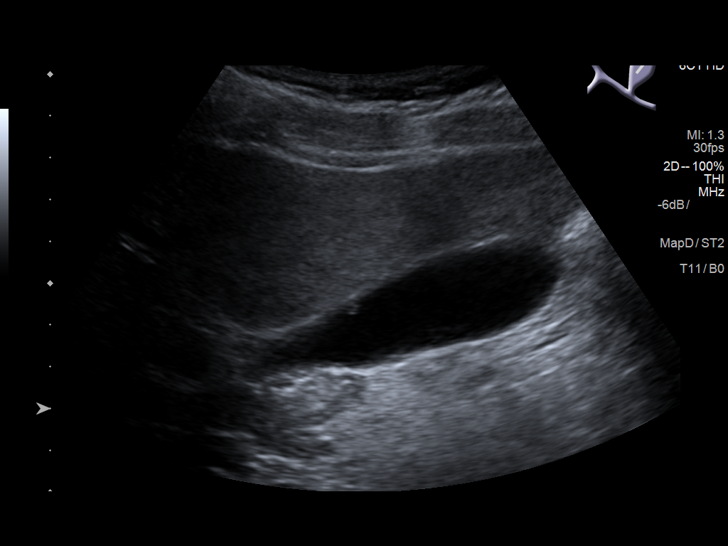
[im 17/51]
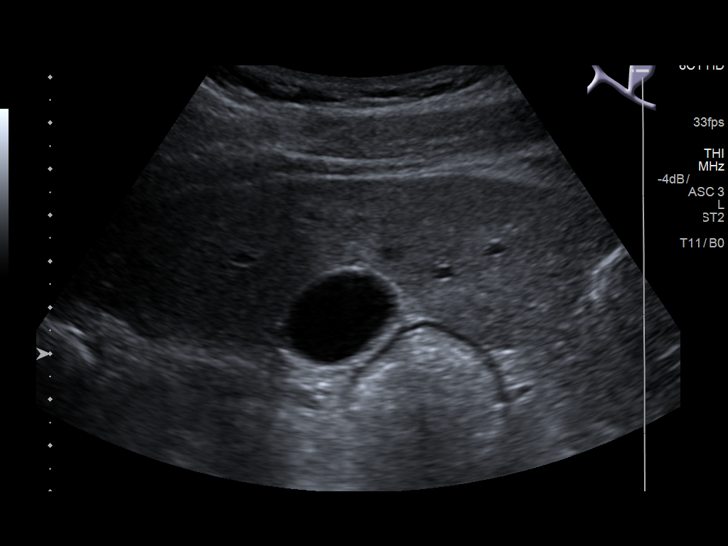
[im 19/51]
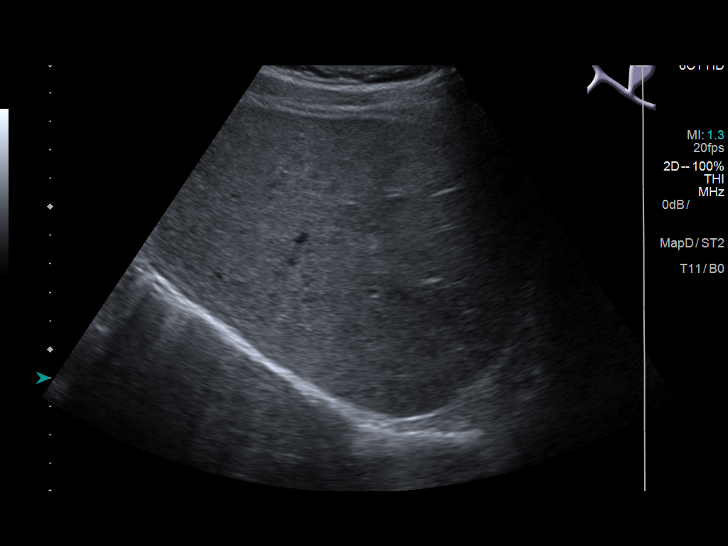
[im 23/51]
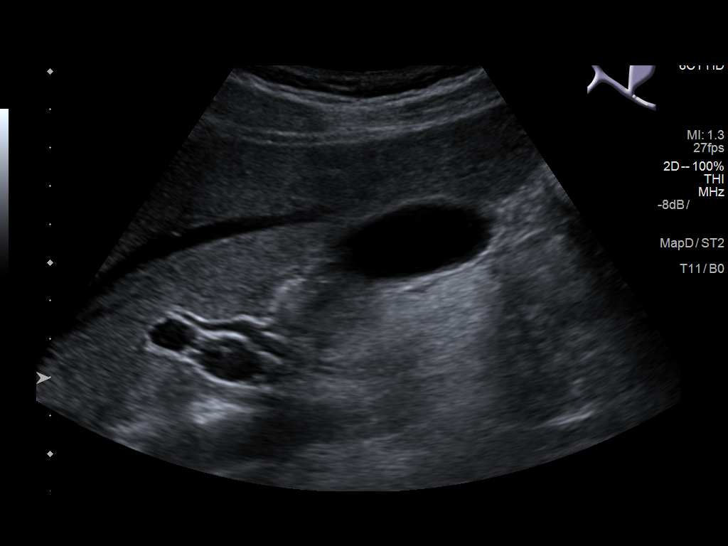
[im 28/51]
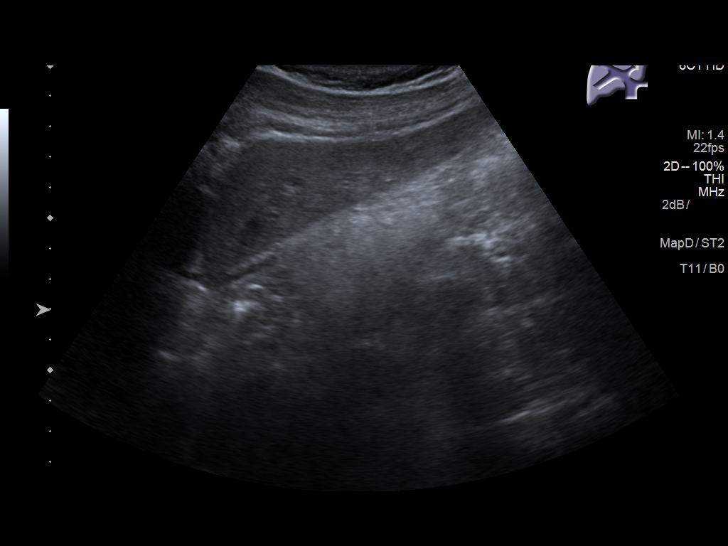
[im 32/51]
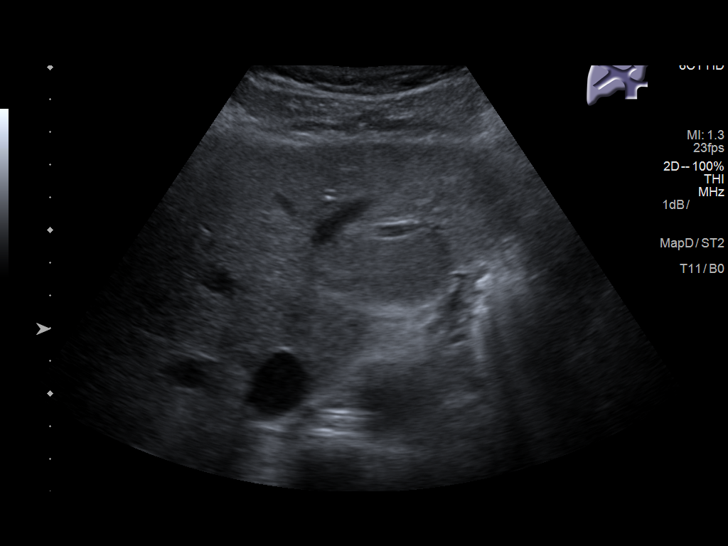
[im 34/51]
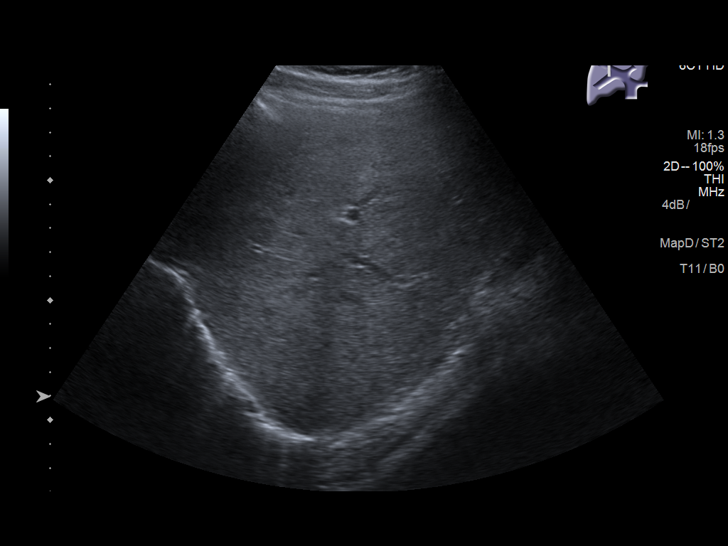
[im 38/51]
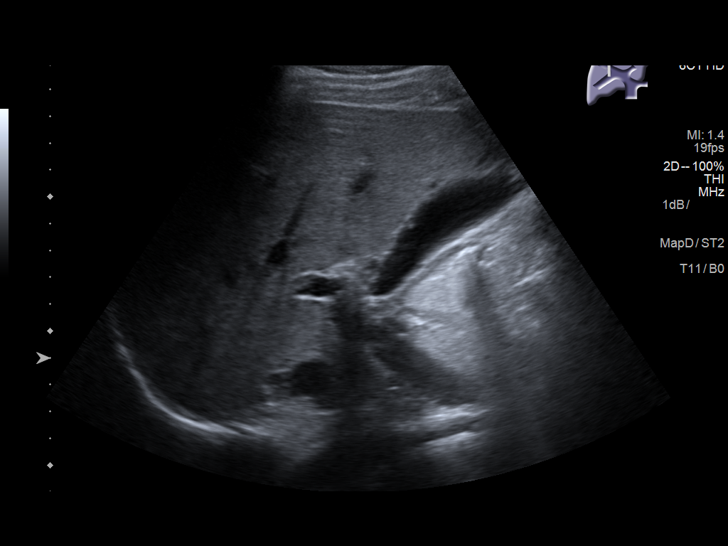
[im 42/51]
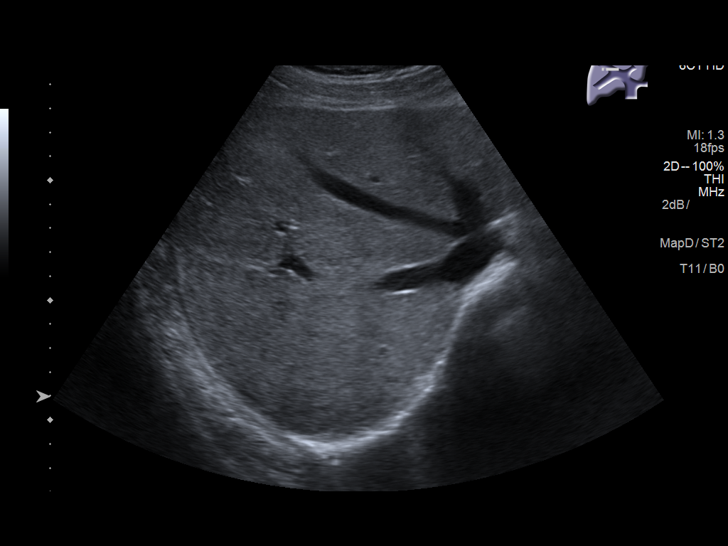
[im 46/51]
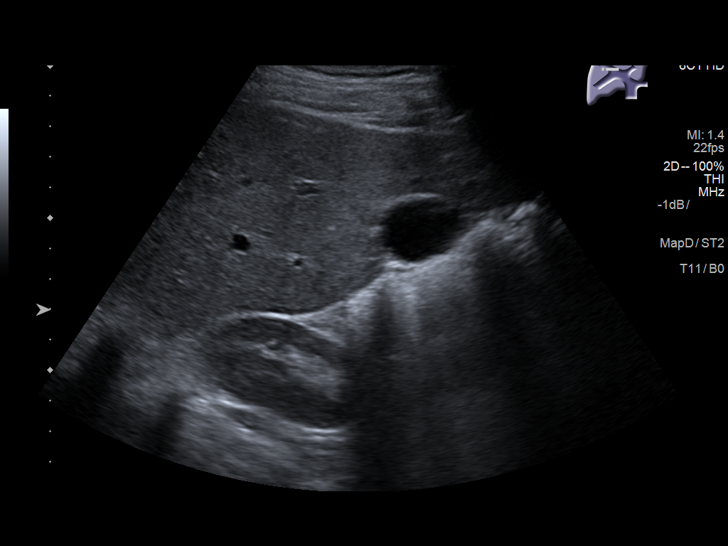
[im 51/51]
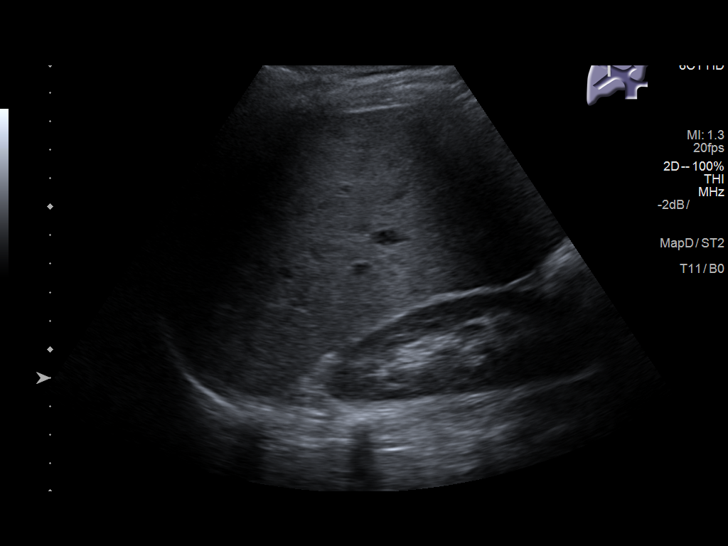

[14 of 25 positions shown; findings below may reference images not displayed]

FINDINGS: Gallbladder:

No shadowing stones. Several tiny gallbladder polyps measuring up to
4 mm. Negative sonographic Murphy. Normal wall thickness

Common bile duct:

Diameter: 3 mm

Liver:

No focal lesion identified. Within normal limits in parenchymal
echogenicity. Portal vein is patent on color Doppler imaging with
normal direction of blood flow towards the liver.
IMPRESSION: 1. Negative for acute cholecystitis or biliary dilatation
2. Small gallbladder polyps

## 2020-06-15 ENCOUNTER — Emergency Department: Payer: Medicaid Other

## 2020-06-15 ENCOUNTER — Other Ambulatory Visit: Payer: Self-pay

## 2020-06-15 ENCOUNTER — Emergency Department
Admission: EM | Admit: 2020-06-15 | Discharge: 2020-06-16 | Disposition: A | Discharge status: Home / Self Care | Payer: Medicaid Other | Attending: Emergency Medicine | Admitting: Emergency Medicine

## 2020-06-15 DIAGNOSIS — N939 Abnormal uterine and vaginal bleeding, unspecified: Secondary | ICD-10-CM | POA: Insufficient documentation

## 2020-06-15 DIAGNOSIS — R0789 Other chest pain: Secondary | ICD-10-CM

## 2020-06-15 DIAGNOSIS — F17210 Nicotine dependence, cigarettes, uncomplicated: Secondary | ICD-10-CM | POA: Diagnosis not present

## 2020-06-15 DIAGNOSIS — N83201 Unspecified ovarian cyst, right side: Secondary | ICD-10-CM | POA: Diagnosis not present

## 2020-06-15 DIAGNOSIS — R102 Pelvic and perineal pain: Secondary | ICD-10-CM

## 2020-06-15 DIAGNOSIS — R079 Chest pain, unspecified: Secondary | ICD-10-CM | POA: Insufficient documentation

## 2020-06-15 DIAGNOSIS — R52 Pain, unspecified: Secondary | ICD-10-CM

## 2020-06-15 LAB — CBC
HCT: 41.1 % (ref 36.0–46.0)
Hemoglobin: 15 g/dL (ref 12.0–15.0)
MCH: 33.9 pg (ref 26.0–34.0)
MCHC: 36.5 g/dL — ABNORMAL HIGH (ref 30.0–36.0)
MCV: 92.8 fL (ref 80.0–100.0)
Platelets: 155 10*3/uL (ref 150–400)
RBC: 4.43 MIL/uL (ref 3.87–5.11)
RDW: 12.6 % (ref 11.5–15.5)
WBC: 8.6 10*3/uL (ref 4.0–10.5)
nRBC: 0 % (ref 0.0–0.2)

## 2020-06-15 LAB — POC URINE PREG, ED: Preg Test, Ur: NEGATIVE

## 2020-06-15 LAB — BASIC METABOLIC PANEL
Anion gap: 9 (ref 5–15)
BUN: 17 mg/dL (ref 6–20)
CO2: 25 mmol/L (ref 22–32)
Calcium: 9 mg/dL (ref 8.9–10.3)
Chloride: 104 mmol/L (ref 98–111)
Creatinine, Ser: 0.71 mg/dL (ref 0.44–1.00)
GFR calc Af Amer: >60 mL/min (ref >60)
GFR calc non Af Amer: >60 mL/min (ref >60)
Glucose, Bld: 116 mg/dL — ABNORMAL HIGH (ref 70–99)
Potassium: 4.2 mmol/L (ref 3.5–5.1)
Sodium: 138 mmol/L (ref 135–145)

## 2020-06-15 LAB — TROPONIN I (HIGH SENSITIVITY)
Troponin I (High Sensitivity): <2 ng/L (ref <18)
Troponin I (High Sensitivity): <2 ng/L (ref <18)

## 2020-06-15 MED ORDER — ONDANSETRON HCL 4 MG/2ML IJ SOLN
4.0000 mg | Freq: Once | INTRAMUSCULAR | Status: AC
  Administered 2020-06-15: 4 mg via INTRAVENOUS
  Filled 2020-06-15: qty 2

## 2020-06-15 MED ORDER — MORPHINE SULFATE (PF) 4 MG/ML IV SOLN
4.0000 mg | Freq: Once | INTRAVENOUS | Status: AC
  Administered 2020-06-15: 4 mg via INTRAVENOUS
  Filled 2020-06-15: qty 1

## 2020-06-15 NOTE — ED Notes (Addendum)
See triage note. Pt reports abd/chest discomfort and vaginal bleeding for past month. States it is constant. Has tried "ice" on it at home without relief. Pt tender at LLQ and RUQ; c/o lower to mid back pain; pain in pelvis. Denies burning upon urination. Denies history of kidney/gall stones and UTI. Reports neck and L arm pain sometimes. Resp reg/unlabored. Skin dry; pt relaxed. Pelvic cart at bedside. Pt denies history of HTN.

## 2020-06-15 NOTE — ED Triage Notes (Addendum)
Pt comes via POV from home with c/o vaginal bleeding for over a month. Pt states no birth control since January. Pt states she has been bleeding heavy.  Pt states no discharge just bleeding. Pt states some N/V/D  Pt also states CP that radiates to her mid upper back .

## 2020-06-15 NOTE — ED Notes (Signed)
This RN present while EDP Sung completing pelvic assessment.

## 2020-06-15 NOTE — ED Notes (Signed)
Blue/lt grn/lav sent to lab.

## 2020-06-15 NOTE — ED Provider Notes (Signed)
[facility] Emergency Department Provider Note   ____________________________________________   First MD Initiated Contact with Patient 06/15/20 2311     (approximate)  I have reviewed the triage vital signs and the nursing notes.   HISTORY  Chief Complaint Vaginal Bleeding and Chest Pain    HPI Laurie Oliver is a 34 y.o. female who presents to the ED from home with a chief complaint of vaginal bleeding and chest pain.  Symptoms have been ongoing for over 1 month.  Last Depo shot was in January of this year; none since.  Reports some spotting last month but heavier bleeding this month with clots.  Feeling lightheaded.  Also states around the same time she began to have sharp chest pains which radiate to her mid upper back.  Experiences these pains mainly in the mornings and evenings.  Not associated with deep breathing.  Denies fever, cough, shortness of breath, abdominal pain, nausea or vomiting.      Past Medical History:  Diagnosis Date  . Alcohol use affecting pregnancy in first trimester 04/21/2019  . Bacterial vaginosis   . Bartholin's gland abscess   . Heart murmur   . Hepatitis C    treated-in remission    Patient Active Problem List   Diagnosis Date Noted  . IUGR (intrauterine growth restriction) affecting care of mother 05/08/2019  . Postpartum care following vaginal delivery 05/08/2019  . Pregnancy complicated by subutex maintenance, antepartum (HCC) 04/21/2019  . Intrauterine growth restriction (IUGR) affecting care of mother, third trimester, single gestation 04/21/2019  . Back pain affecting pregnancy 02/22/2019  . Supervision of high risk pregnancy, antepartum 11/02/2018  . Smoking (tobacco) complicating pregnancy, unspecified trimester 11/02/2018  . History of hepatitis C 01/14/2018    Past Surgical History:  Procedure Laterality Date  .  Barholins gland abscess drainage      Prior to Admission medications   Medication Sig  Start Date End Date Taking? Authorizing Provider  buprenorphine (SUBUTEX) 8 MG SUBL SL tablet Place 4 mg under the tongue 2 (two) times daily.     [provider]  HYDROcodone-acetaminophen (NORCO) 5-325 MG tablet Take 1 tablet by mouth every 6 (six) hours as needed for moderate pain. 06/16/20   Irean Hong, MD  medroxyPROGESTERone (DEPO-PROVERA) 150 MG/ML injection Inject 1 mL (150 mg total) into the muscle every 3 (three) months. 08/11/19 11/09/19  Tresea Mall, CNM  omeprazole (PRILOSEC) 20 MG capsule Take 1 capsule (20 mg total) by mouth daily. 11/22/18   Vena Austria, MD  Prenat-Fe Poly-Methfol-FA-DHA (VITAFOL FE+) 90-0.6-0.4-200 MG CAPS Take 1 tablet by mouth daily. 11/02/18   Natale Milch, MD    Allergies Motrin [ibuprofen], Aspirin, Benzalkonium chloride, and Neosporin [neomycin-bacitracin zn-polymyx]  Family History  Problem Relation Age of Onset  . Ovarian cancer Mother   . Hypertension Maternal Grandmother   . Hypertension Maternal Grandfather   . Heart disease Maternal Grandfather   . Lung cancer Paternal Grandmother     Social History Social History   Tobacco Use  . Smoking status: Current Every Day Smoker    Packs/day: 0.50    Types: Cigarettes  . Smokeless tobacco: Never Used  Vaping Use  . Vaping Use: Never used  Substance Use Topics  . Alcohol use: Yes    Comment: every night  . Drug use: Not Currently    Types: Other-see comments    Comment: Subutex current, Hx of Cannabis and Opioid     Review of Systems  Constitutional: No fever/chills Eyes: No visual changes. ENT: No sore throat. Cardiovascular: Positive for chest pain. Respiratory: Denies shortness of breath. Gastrointestinal: No abdominal pain.  No nausea, no vomiting.  No diarrhea.  No constipation. Genitourinary: Positive for vaginal bleeding.  Negative for dysuria. Musculoskeletal: Negative for back pain. Skin: Negative for rash. Neurological: Negative for headaches,  focal weakness or numbness.   ____________________________________________   PHYSICAL EXAM:  VITAL SIGNS: ED Triage Vitals [06/15/20 1640]  Enc Vitals Group     BP (!) 137/104     Pulse Rate 90     Resp 18     Temp 98 F (36.7 C)     Temp src      SpO2 100 %     Weight 120 lb (54.4 kg)     Height 5' (1.524 m)     Head Circumference      Peak Flow      Pain Score 3     Pain Loc      Pain Edu?      Excl. in GC?     Constitutional: Alert and oriented. Well appearing and in no acute distress. Eyes: Conjunctivae are normal. PERRL. EOMI. Head: Atraumatic. Nose: No congestion/rhinnorhea. Mouth/Throat: Mucous membranes are moist.  Oropharynx non-erythematous. Neck: No stridor.   Cardiovascular: Normal rate, regular rhythm. Grossly normal heart sounds.  Good peripheral circulation. Respiratory: Normal respiratory effort.  No retractions. Lungs CTAB. Gastrointestinal: Soft and nontender to light or deep palpation. No distention. No abdominal bruits. No CVA tenderness. Musculoskeletal: No lower extremity tenderness nor edema.  No joint effusions. Neurologic:  Normal speech and language. No gross focal neurologic deficits are appreciated. No gait instability. Skin:  Skin is warm, dry and intact. No rash noted. Psychiatric: Mood and affect are normal. Speech and behavior are normal.  ____________________________________________   LABS (all labs ordered are listed, but only abnormal results are displayed)  Labs Reviewed  CBC - Abnormal; Notable for the following components:      Result Value   MCHC 36.5 (*)    All other components within normal limits  BASIC METABOLIC PANEL - Abnormal; Notable for the following components:   Glucose, Bld 116 (*)    All other components within normal limits  HEPATIC FUNCTION PANEL  LIPASE, BLOOD  FIBRIN DERIVATIVES D-DIMER ([facility] ONLY)  POC URINE PREG, ED  TROPONIN I (HIGH SENSITIVITY)  TROPONIN I (HIGH SENSITIVITY)    ____________________________________________  EKG  ED ECG REPORT I, Faye Strohman J, the attending physician, personally viewed and interpreted this ECG.   Date: 06/16/2020  EKG Time: 1656  Rate: 78  Rhythm: normal EKG, normal sinus rhythm  Axis: Normal  Intervals:none  ST&T Change: Nonspecific  ____________________________________________  RADIOLOGY  ED MD interpretation: No acute cardiopulmonary process; simple right ovarian cyst  Official radiology report(s): DG Chest 2 View  Result Date: 06/15/2020 CLINICAL DATA:  Chest pain radiating to the back. EXAM: CHEST - 2 VIEW COMPARISON:  None. FINDINGS: The cardiomediastinal contours are normal. The lungs are clear. Pulmonary vasculature is normal. No consolidation, pleural effusion, or pneumothorax. No acute osseous abnormalities are seen. IMPRESSION: Negative radiographs of the chest. Electronically Signed   By: Narda Rutherford M.D.   On: 06/15/2020 17:59   US PELVIC COMPLETE W TRANSVAGINAL AND TORSION R/O  Result Date: 06/16/2020 CLINICAL DATA:  Initial evaluation for acute pelvic pain and bleeding. EXAM: TRANSABDOMINAL AND TRANSVAGINAL ULTRASOUND OF PELVIS DOPPLER ULTRASOUND OF OVARIES TECHNIQUE: Both transabdominal and transvaginal ultrasound examinations of the pelvis  were performed. Transabdominal technique was performed for global imaging of the pelvis including uterus, ovaries, adnexal regions, and pelvic cul-de-sac. It was necessary to proceed with endovaginal exam following the transabdominal exam to visualize the uterus, endometrium, and ovaries. Color and duplex Doppler ultrasound was utilized to evaluate blood flow to the ovaries. COMPARISON:  None available. FINDINGS: Uterus Measurements: 7.8 x 3.6 x 5.0 cm = volume: 72 mL. Uterus is anteverted. No discrete fibroid or other mass lesion. Endometrium Thickness: 3 mm few small echogenic foci in noted, likely small microcalcifications, doubtful significance. Small volume simple  fluid noted within the endocervical canal. No focal abnormality visualized. Right ovary Measurements: 3.6 x 2.7 x 3.5 cm = volume: 18.0 mL. 3.2 x 3.1 x 2.8 cm simple cyst. No internal complexity, vascularity, or solid nodularity. No other adnexal mass. Left ovary Measurements: 2.1 x 1.2 x 1.3 cm = volume: 1.7 mL. Normal appearance/no adnexal mass. Pulsed Doppler evaluation of both ovaries demonstrates normal low-resistance arterial and venous waveforms. Other findings No abnormal free fluid. IMPRESSION: 1. 3.2 cm simple right ovarian cyst. This has benign characteristics and is a common finding in premenopausal females. No imaging follow up is required. This follows consensus guidelines: Simple Adnexal Cysts: SRU Consensus Conference Update on Follow-up and Reporting. Radiology 2019; [id number]. 2. No evidence for ovarian torsion or other acute abnormality. 3. Endometrial stripe within normal limits measuring 3 mm in thickness. If bleeding remains unresponsive to hormonal or medical therapy, sonohysterogram should be considered for focal lesion work-up. (Ref: Radiological Reasoning: Algorithmic Workup of Abnormal Vaginal Bleeding with Endovaginal Sonography and Sonohysterography. AJR 2008; 932:T55-73). Electronically Signed   By: Rise Mu M.D.   On: 06/16/2020 00:45    ____________________________________________   PROCEDURES  Procedure(s) performed (including Critical Care):  Procedures   ____________________________________________   INITIAL IMPRESSION / ASSESSMENT AND PLAN / ED COURSE  As part of my medical decision making, I reviewed the following data within the electronic MEDICAL RECORD NUMBER Nursing notes reviewed and incorporated, Labs reviewed, EKG interpreted, Old chart reviewed, Radiograph reviewed, Notes from prior ED visits and [place] Controlled Substance Database        34 year old female presenting with vaginal bleeding and chest pain. Differential diagnosis includes, but  is not limited to, ACS, aortic dissection, pulmonary embolism, cardiac tamponade, pneumothorax, pneumonia, pericarditis, myocarditis, GI-related causes including esophagitis/gastritis, and musculoskeletal chest wall pain, fibroids, endometriosis, pregnancy related pain including ectopic pregnancy, etc.  Laboratory results unremarkable including negative troponin and urine pregnancy.  Will obtain pelvic ultrasound.  Check LFTs/lipase, D-dimer.  Administer IV morphine for pain paired with IV Zofran for nausea.  Will reassess.   Clinical Course as of Jun 16 134  Sun Jun 16, 2020  2202 Updated patient on all test results.  Will refer to GYN for outpatient follow-up.  Will discharge home with prescription for Norco to use as needed.  Strict return precautions given.  Patient verbalizes understanding agrees with plan of care.   [JS]    Clinical Course User Index [JS] Irean Hong, MD     ____________________________________________   FINAL CLINICAL IMPRESSION(S) / ED DIAGNOSES  Final diagnoses:  Pain  Vaginal bleeding  Atypical chest pain  Cyst of right ovary     ED Discharge Orders         Ordered    HYDROcodone-acetaminophen (NORCO) 5-325 MG tablet  Every 6 hours PRN        06/16/20 0053          *Please note:  James IvanoffShannon N Yacoub was evaluated in Emergency Department on 06/16/2020 for the symptoms described in the history of present illness. She was evaluated in the context of the global COVID-19 pandemic, which necessitated consideration that the patient might be at risk for infection with the SARS-CoV-2 virus that causes COVID-19. Institutional protocols and algorithms that pertain to the evaluation of patients at risk for COVID-19 are in a state of rapid change based on information released by regulatory bodies including the CDC and federal and state organizations. These policies and algorithms were followed during the patient's care in the ED.  Some ED evaluations and interventions  may be delayed as a result of limited staffing during and the pandemic.*   Note:  This document was prepared using Dragon voice recognition software and may include unintentional dictation errors.   Irean HongSung, Clair Alfieri J, MD 06/16/20 [phone]

## 2020-06-16 LAB — FIBRIN DERIVATIVES D-DIMER (ARMC ONLY): Fibrin derivatives D-dimer (ARMC): 128.38 ng/mL (FEU) (ref 0.00–499.00)

## 2020-06-16 LAB — HEPATIC FUNCTION PANEL
ALT: 13 U/L (ref 0–44)
AST: 17 U/L (ref 15–41)
Albumin: 4.4 g/dL (ref 3.5–5.0)
Alkaline Phosphatase: 55 U/L (ref 38–126)
Bilirubin, Direct: <0.1 mg/dL (ref 0.0–0.2)
Total Bilirubin: 0.7 mg/dL (ref 0.3–1.2)
Total Protein: 7.4 g/dL (ref 6.5–8.1)

## 2020-06-16 LAB — LIPASE, BLOOD: Lipase: 27 U/L (ref 11–51)

## 2020-06-16 MED ORDER — HYDROCODONE-ACETAMINOPHEN 5-325 MG PO TABS: 1.0000 | ORAL_TABLET | Freq: Four times a day (QID) | ORAL | 0 refills | Status: DC | PRN

## 2020-06-16 NOTE — ED Notes (Signed)
Called lab about d-dimer since this RN cannot see it as received by lab in system. Lab states will result in about 5 minutes.

## 2020-06-16 NOTE — ED Notes (Signed)
Pt offered wheelchair ride to lobby. Pt declined.

## 2020-06-16 NOTE — ED Notes (Signed)
EDP Sung at bedside.  

## 2020-06-16 NOTE — ED Notes (Signed)
Pt states pain got down to a 4/10 at best but that it has come up to 7/10 since [place]. Pt sitting calmly in bed; watching tv.

## 2020-06-16 NOTE — Discharge Instructions (Signed)
1.  You may take Norco as needed for pain. 2.  Return to the ER for worsening symptoms, persistent vomiting, difficulty breathing or other concerns. 

## 2020-06-16 NOTE — ED Notes (Signed)
Pt signed printed d/c paperwork as topaz frozen.  °

## 2020-11-22 DIAGNOSIS — R41840 Attention and concentration deficit: Secondary | ICD-10-CM | POA: Insufficient documentation

## 2021-06-24 DIAGNOSIS — K219 Gastro-esophageal reflux disease without esophagitis: Secondary | ICD-10-CM | POA: Insufficient documentation

## 2021-09-21 NOTE — L&D Delivery Note (Signed)
/  Delivery Note  TOBA CLAUDIO is a [id number] at [redacted]w[redacted]d with an LMP of 08/09/21, consistent with [place] at [redacted]w[redacted]d.   First Stage: Labor onset: 1507 Induction: oxytocin and AROM Analgesia /Anesthesia intrapartum: epidural AROM at 1507 GBS: negative IP Antibiotics: none  Second Stage: Complete dilation at 1944 Onset of pushing at 1956 FHR second stage 125 bpm with moderate,  decels with pushing   Christne presented to L&D with uncontrolled GDM. She was 3/50/-3. She progressed  to C/C/+2 with a spontaneous urge to push.  She pushed  effectively over approximately 8 minutes for a spontaneous vaginal birth.  Delivery of a viable baby boy on 04/30/22 at 2004 by CNM Delivery of fetal head in OA position with restitution to LOT. no nuchal cord;  Anterior then posterior shoulders delivered easily with gentle downward traction. Baby placed on mom's chest, and attended to by baby RN Cord double clamped after cessation of pulsation, cut by FOB  Cord blood sample collection: Yes O POS Collection of cord blood donation N/A Arterial cord blood sample N/A  Third Stage: Oxytocin bolus started after delivery of infant for hemorrhage prophylaxis  Placenta delivered Tomasa Blase intact with 3 VC @ 2012 Placenta disposition: discarded per protocol Uterine tone firm / bleeding moderate  no laceration identified  Anesthesia for repair: epidural Repair none Est. Blood Loss (mL): 300  Complications: none  Mom to postpartum.  Baby to NICU.  Newborn: Information for the patient's newborn:  Laurie Oliver, Laurie Oliver [[id number]]  Live born child "Marin Comment" Birth Weight:   APGAR: ,   Newborn Delivery   Birth date/time: 04/30/2022 20:04:00 Delivery type: Vaginal, Spontaneous       Feeding planned: breast and formula feeding  ---------- Chari Manning CNM Certified Nurse Midwife [place]  [facility]

## 2021-10-09 ENCOUNTER — Encounter: Payer: Medicaid Other | Admitting: Advanced Practice Midwife

## 2021-10-10 ENCOUNTER — Ambulatory Visit (INDEPENDENT_AMBULATORY_CARE_PROVIDER_SITE_OTHER): Payer: Medicaid Other | Admitting: Advanced Practice Midwife

## 2021-10-10 ENCOUNTER — Encounter: Payer: Self-pay | Admitting: Advanced Practice Midwife

## 2021-10-10 ENCOUNTER — Other Ambulatory Visit: Payer: Self-pay

## 2021-10-10 ENCOUNTER — Other Ambulatory Visit (HOSPITAL_COMMUNITY)
Admission: RE | Admit: 2021-10-10 | Discharge: 2021-10-10 | Disposition: A | Discharge status: Home / Self Care | Payer: Medicaid Other | Source: Ambulatory Visit | Attending: Advanced Practice Midwife | Admitting: Advanced Practice Midwife

## 2021-10-10 VITALS — BP 120/80 | Wt 121.0 lb

## 2021-10-10 DIAGNOSIS — Z369 Encounter for antenatal screening, unspecified: Secondary | ICD-10-CM | POA: Insufficient documentation

## 2021-10-10 DIAGNOSIS — Z8759 Personal history of other complications of pregnancy, childbirth and the puerperium: Secondary | ICD-10-CM

## 2021-10-10 DIAGNOSIS — O99330 Smoking (tobacco) complicating pregnancy, unspecified trimester: Secondary | ICD-10-CM

## 2021-10-10 DIAGNOSIS — Z3490 Encounter for supervision of normal pregnancy, unspecified, unspecified trimester: Secondary | ICD-10-CM | POA: Insufficient documentation

## 2021-10-10 DIAGNOSIS — Z124 Encounter for screening for malignant neoplasm of cervix: Secondary | ICD-10-CM

## 2021-10-10 DIAGNOSIS — Z113 Encounter for screening for infections with a predominantly sexual mode of transmission: Secondary | ICD-10-CM

## 2021-10-10 DIAGNOSIS — Z3481 Encounter for supervision of other normal pregnancy, first trimester: Secondary | ICD-10-CM | POA: Diagnosis present

## 2021-10-10 DIAGNOSIS — F1991 Other psychoactive substance use, unspecified, in remission: Secondary | ICD-10-CM

## 2021-10-10 LAB — POCT URINE PREGNANCY: Preg Test, Ur: POSITIVE — AB

## 2021-10-10 MED ORDER — NICOTINE 14 MG/24HR TD PT24: 14.0000 mg | MEDICATED_PATCH | Freq: Every day | TRANSDERMAL | 1 refills | Status: DC

## 2021-10-10 MED ORDER — CITRANATAL ASSURE 35-1 & 300 MG PO MISC: 2.0000 | Freq: Every day | ORAL | 11 refills | Status: DC

## 2021-10-10 MED ORDER — VITAFOL FE+ 90-0.6-0.4-200 MG PO CAPS: 1.0000 | ORAL_CAPSULE | Freq: Every day | ORAL | 11 refills | Status: AC

## 2021-10-10 NOTE — Patient Instructions (Signed)

## 2021-10-10 NOTE — Progress Notes (Addendum)
New Obstetric Patient H&P    Chief Complaint: "Desires prenatal care"   History of Present Illness: Patient is a 36 y.o. [id number] Not Hispanic or Latino female, presents with amenorrhea and positive home pregnancy test. Patient's last menstrual period was 08/09/2021 (within days). and based on her  LMP, her EDD is Estimated Date of Delivery: 05/16/22 and her EGA is [redacted]w[redacted]d. Cycles are 2-3 days, regular, and occur approximately every : 28 days. Her last pap smear was 3 years ago and was no abnormalities.    She had a urine pregnancy test which was positive 3 week(s)  ago. Her last menstrual period was normal and lasted for  2 or 3 day(s). Since her LMP she claims she has experienced breast tenderness, fatigue, nausea, vomiting. She denies vaginal bleeding. Her past medical history is contributory for: substance abuse (currently on Subutex), smoking (current). Her prior pregnancies are notable for  IUGR.  Since her LMP, she admits to the use of tobacco products  yes 1/2 PPD and is trying to quit- she requests nicotine patch. She claims she has gained  6  pounds since the start of her pregnancy.  There are cats in the home in the home  yes - no inside litterbox She admits close contact with children on a regular basis  yes  She has had chicken pox in the past yes She has had Tuberculosis exposures, symptoms, or previously tested positive for TB   no Current or past history of domestic violence. no  Genetic Screening/Teratology Counseling: (Includes patient, baby's father, or anyone in either family with:)   1. Patient's age >/= 29 at [facility]  no 2. Thalassemia ([place], [place], Mediterranean, or Asian background): MCV<80  no 3. Neural tube defect (meningomyelocele, spina bifida, anencephaly)  no 4. Congenital heart defect  no  5. Down syndrome  no 6. Tay-Sachs (Jewish, [place]  no 7. Canavan's Disease  no 8. Sickle cell disease or trait (African)  no  9. Hemophilia or other blood  disorders  no  10. Muscular dystrophy  no  11. Cystic fibrosis  no  12. Huntington's Chorea  no  13. Mental retardation/autism  no 14. Other inherited genetic or chromosomal disorder  no 15. Maternal metabolic disorder (DM, PKU, etc)  no 16. Patient or FOB with a child with a birth defect not listed above no  16a. Patient or FOB with a birth defect themselves no 17. Recurrent pregnancy loss, or stillbirth  no  18. Any medications since LMP other than prenatal vitamins (include vitamins, supplements, OTC meds, drugs, alcohol)  no 19. Any other genetic/environmental exposure to discuss  no  Infection History:   1. Lives with someone with TB or TB exposed  no  2. Patient or partner has history of genital herpes  no 3. Rash or viral illness since LMP  no 4. History of STI (GC, CT, HPV, syphilis, HIV)  no 5. History of recent travel :  no  Other pertinent information:  She is taking Subutex 4mg  2 times daily     Review of Systems:10 point review of systems negative unless otherwise noted in HPI  Past Medical History:  Patient Active Problem List   Diagnosis Date Noted   Supervision of normal pregnancy 10/10/2021     Nursing Staff Provider  Office Location  [facility] Dating    Language  English Anatomy 10/12/2021    Flu Vaccine   Genetic Screen  NIPS:   TDaP vaccine    Hgb A1C  or  GTT Early : NA Third trimester :   Covid    LAB RESULTS   Rhogam   Blood Type     Feeding Plan Breast/Formula Antibody    Contraception  Rubella    Circumcision  RPR     Pediatrician   HBsAg     Support Person Clifton Custardaron HIV    Prenatal Classes  Varicella     GBS  (For PCN allergy, check sensitivities)   BTL Consent     VBAC Consent  Pap  10/10/21    Hgb Electro    Pelvis Tested  CF      SMA            History of prior pregnancy with IUGR newborn 10/10/2021   Gastric reflux 06/24/2021   Attention and concentration deficit 11/22/2020   Pregnancy complicated by subutex maintenance, antepartum (HCC)  04/21/2019   Smoking (tobacco) complicating pregnancy, unspecified trimester 11/02/2018   History of hepatitis C 01/14/2018    Now in remission Diagnosed 2015, prenatal screening 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 25, 2018 Risks-previous substance use    History of substance abuse (HCC) 01/04/2017    Past Surgical History:  Past Surgical History:  Procedure Laterality Date    Barholins gland abscess drainage      Gynecologic History: Patient's last menstrual period was 08/09/2021 (within days).  Obstetric History: [id number]  Family History:  Family History  Problem Relation Age of Onset   Ovarian cancer Mother    Hypertension Maternal Grandmother    Hypertension Maternal Grandfather    Heart disease Maternal Grandfather    Lung cancer Paternal Grandmother     Social History:  Social History   Socioeconomic History   Marital status: Married    Spouse name: Not on file   Number of children: Not on file   Years of education: Not on file   Highest education level: Not on file  Occupational History   Not on file  Tobacco Use   Smoking status: Every Day    Packs/day: 0.50    Types: Cigarettes   Smokeless tobacco: Never  Vaping Use   Vaping Use: Never used  Substance and Sexual Activity   Alcohol use: Yes    Comment: every night   Drug use: Not Currently    Types: Other-see comments    Comment: Subutex current, Hx of Cannabis and Opioid    Sexual activity: Yes    Birth control/protection: Injection    Comment: depo  Other Topics Concern   Not on file  Social History Narrative   Not on file   Social Determinants of Health   Financial Resource Strain: Not on file  Food Insecurity: Not on file  Transportation Needs: Not on file  Physical Activity: Not on file  Stress: Not on file  Social Connections: Not on file  Intimate Partner Violence: Not on file    Allergies:  Allergies  Allergen Reactions    Motrin [Ibuprofen] Nausea And Vomiting   Aspirin Swelling   Benzalkonium Chloride Swelling   Neosporin [Neomycin-Bacitracin Zn-Polymyx] Swelling    Medications: Prior to Admission medications   Medication Sig Start Date End Date Taking? Authorizing Provider  amoxicillin (AMOXIL) 875 MG tablet Take 875 mg by mouth 2 (two) times daily. 09/30/21  Yes [provider]  buprenorphine (SUBUTEX) 8 MG SUBL SL tablet Place 4 mg under the tongue 2 (two) times daily.    Yes [provider]  nicotine (NICODERM CQ) 14 mg/24hr patch Place 1 patch (14 mg total) onto the skin daily. 10/10/21  Yes Tresea Mall, CNM  Prenat-Fe Poly-Methfol-FA-DHA (VITAFOL FE+) 90-0.6-0.4-200 MG CAPS Take 1 tablet by mouth daily. 10/10/21  Yes Tresea Mall, CNM  HYDROcodone-acetaminophen (NORCO) 5-325 MG tablet Take 1 tablet by mouth every 6 (six) hours as needed for moderate pain. Patient not taking: Reported on 10/10/2021 06/16/20   Irean Hong, MD  medroxyPROGESTERone (DEPO-PROVERA) 150 MG/ML injection Inject 1 mL (150 mg total) into the muscle every 3 (three) months. 08/11/19 11/09/19  Tresea Mall, CNM  omeprazole (PRILOSEC) 20 MG capsule Take 1 capsule (20 mg total) by mouth daily. Patient not taking: Reported on 10/10/2021 11/22/18   Vena [place], MD    Physical Exam Vitals: Blood pressure 120/80, weight 121 lb (54.9 kg), last menstrual period 08/09/2021  General: NAD HEENT: normocephalic, anicteric Thyroid: no enlargement, no palpable nodules Pulmonary: No increased work of breathing, CTAB Cardiovascular: RRR, distal pulses 2+ Abdomen: NABS, soft, non-tender, non-distended.  Umbilicus without lesions.  No hepatomegaly, splenomegaly or masses palpable. No evidence of hernia  Genitourinary:  External: Normal external female genitalia.  Normal urethral meatus, normal  Bartholin's and Skene's glands.    Vagina: Normal vaginal mucosa, no evidence of prolapse.    Cervix: Grossly normal in  appearance, no bleeding Extremities: no edema, erythema, or tenderness Neurologic: Grossly intact Psychiatric: mood appropriate, affect full   The following were addressed during this visit:  Breastfeeding Education - Early initiation of breastfeeding    Comments: Keeps milk supply adequate, helps contract uterus and slow bleeding, and early milk is the perfect first food and is easy to digest.   - The importance of exclusive breastfeeding    Comments: Provides antibodies, Lower risk of breast and ovarian cancers, and type-2 diabetes,Helps your body recover, Reduced chance of SIDS.   - Risks of giving your baby anything other than breast milk if you are breastfeeding    Comments: Make the baby less content with breastfeeds, may make my baby more susceptible to illness, and may reduce my milk supply.   - The importance of early skin-to-skin contact    Comments:  Keeps baby warm and secure, helps keep babys blood sugar up and breathing steady, easier to bond and breastfeed, and helps calm baby.  - Rooming-in on a 24-hour basis    Comments: Easier to learn baby's feeding cues, easier to bond and get to know each other, and encourages milk production.   - Feeding on demand or baby-led feeding    Comments: Helps prevent breastfeeding complications, helps bring in good milk supply, prevents under or overfeeding, and helps baby feel content and satisfied   - Frequent feeding to help assure optimal milk production    Comments: Making a full supply of milk requires frequent removal of milk from breasts, infant will eat 8-12 times in 24 hours, if separated from infant use breast massage, hand expression and/ or pumping to remove milk from breasts.   - Effective positioning and attachment    Comments: Helps my baby to get enough breast milk, helps to produce an adequate milk supply, and helps prevent nipple pain and damage   - Exclusive breastfeeding for the first 6 months     Comments: Builds a healthy milk supply and keeps it up, protects baby from sickness and disease, and breastmilk has everything your baby needs for the first 6 months.    Assessment: 36 y.o. [id number] at [redacted]w[redacted]d by approximate  LMP presenting to initiate prenatal care  Plan: 1) Avoid alcoholic beverages. 2) Patient encouraged not to smoke.  3) Discontinue the use of all non-medicinal drugs and chemicals.  4) Take prenatal vitamins daily.  5) Nutrition, food safety (fish, cheese advisories, and high nitrite foods) and exercise discussed. 6) Hospital and practice style discussed with cross coverage system.  7) Genetic Screening, such as with 1st Trimester Screening, cell free fetal DNA, AFP testing, and Ultrasound, as well as with amniocentesis and CVS as appropriate, is discussed with patient. At the conclusion of today's visit patient requested genetic testing 8) Patient is asked about travel to areas at risk for the Zika virus, and counseled to avoid travel and exposure to mosquitoes or sexual partners who may have themselves been exposed to the virus. Testing is discussed, and will be ordered as appropriate.  9) PAPtima, urine culture, UDS today 10) Rx Nicoderm 14 mg 11) Rx PNV- Vitafol 12) Return to clinic in 1 week for dating u/s and rob 3413) MaterniT 21 at 10+ weeks with other labs 6014) WIC Rx for Boost   Tresea MallJane Jaianna Nicoll, CNM [facility] 10/10/2021, 1:03 PM

## 2021-10-12 LAB — URINE DRUG PANEL 7
Amphetamines, Urine: NEGATIVE ng/mL
Barbiturate Quant, Ur: NEGATIVE ng/mL
Benzodiazepine Quant, Ur: NEGATIVE ng/mL
Cannabinoid Quant, Ur: NEGATIVE ng/mL
Cocaine (Metab.): NEGATIVE ng/mL
Opiate Quant, Ur: NEGATIVE ng/mL
PCP Quant, Ur: NEGATIVE ng/mL

## 2021-10-13 ENCOUNTER — Encounter: Payer: Self-pay | Admitting: Advanced Practice Midwife

## 2021-10-13 LAB — URINE CULTURE

## 2021-10-13 LAB — CYTOLOGY - PAP
Chlamydia: NEGATIVE
Comment: NEGATIVE
Comment: NEGATIVE
Comment: NEGATIVE
Comment: NORMAL
Diagnosis: NEGATIVE
High risk HPV: NEGATIVE
Neisseria Gonorrhea: NEGATIVE
Trichomonas: NEGATIVE

## 2021-10-17 ENCOUNTER — Ambulatory Visit
Admission: RE | Admit: 2021-10-17 | Discharge: 2021-10-17 | Disposition: A | Discharge status: Home / Self Care | Payer: Medicaid Other | Source: Ambulatory Visit | Attending: Advanced Practice Midwife | Admitting: Advanced Practice Midwife

## 2021-10-17 ENCOUNTER — Other Ambulatory Visit: Payer: Self-pay | Admitting: Advanced Practice Midwife

## 2021-10-17 ENCOUNTER — Other Ambulatory Visit: Payer: Self-pay

## 2021-10-17 DIAGNOSIS — Z3687 Encounter for antenatal screening for uncertain dates: Secondary | ICD-10-CM | POA: Diagnosis present

## 2021-10-17 DIAGNOSIS — Z369 Encounter for antenatal screening, unspecified: Secondary | ICD-10-CM

## 2021-10-17 DIAGNOSIS — Z3A10 10 weeks gestation of pregnancy: Secondary | ICD-10-CM | POA: Diagnosis not present

## 2021-10-17 DIAGNOSIS — Z3481 Encounter for supervision of other normal pregnancy, first trimester: Secondary | ICD-10-CM

## 2021-10-22 ENCOUNTER — Encounter: Payer: Medicaid Other | Admitting: Licensed Practical Nurse

## 2021-11-03 DIAGNOSIS — Z3493 Encounter for supervision of normal pregnancy, unspecified, third trimester: Secondary | ICD-10-CM | POA: Insufficient documentation

## 2022-01-08 DIAGNOSIS — O99332 Smoking (tobacco) complicating pregnancy, second trimester: Secondary | ICD-10-CM | POA: Insufficient documentation

## 2022-04-01 ENCOUNTER — Ambulatory Visit: Payer: Medicaid Other | Admitting: *Deleted

## 2022-04-06 ENCOUNTER — Ambulatory Visit: Payer: Medicaid Other | Admitting: *Deleted

## 2022-04-24 ENCOUNTER — Ambulatory Visit: Payer: Medicaid Other | Admitting: *Deleted

## 2022-04-28 ENCOUNTER — Observation Stay
Admission: EM | Admit: 2022-04-28 | Discharge: 2022-04-29 | Disposition: A | Discharge status: Home / Self Care | Payer: Medicaid Other | Attending: Obstetrics | Admitting: Obstetrics

## 2022-04-28 DIAGNOSIS — Z3A37 37 weeks gestation of pregnancy: Secondary | ICD-10-CM | POA: Insufficient documentation

## 2022-04-28 DIAGNOSIS — O24429 Gestational diabetes mellitus in childbirth, unspecified control: Secondary | ICD-10-CM | POA: Insufficient documentation

## 2022-04-28 DIAGNOSIS — O4193X0 Disorder of amniotic fluid and membranes, unspecified, third trimester, not applicable or unspecified: Principal | ICD-10-CM | POA: Insufficient documentation

## 2022-04-28 DIAGNOSIS — Z79899 Other long term (current) drug therapy: Secondary | ICD-10-CM | POA: Insufficient documentation

## 2022-04-28 DIAGNOSIS — F17210 Nicotine dependence, cigarettes, uncomplicated: Secondary | ICD-10-CM | POA: Insufficient documentation

## 2022-04-28 DIAGNOSIS — O4290 Premature rupture of membranes, unspecified as to length of time between rupture and onset of labor, unspecified weeks of gestation: Secondary | ICD-10-CM | POA: Diagnosis present

## 2022-04-28 DIAGNOSIS — O24419 Gestational diabetes mellitus in pregnancy, unspecified control: Secondary | ICD-10-CM | POA: Insufficient documentation

## 2022-04-28 DIAGNOSIS — O99333 Smoking (tobacco) complicating pregnancy, third trimester: Secondary | ICD-10-CM | POA: Insufficient documentation

## 2022-04-28 DIAGNOSIS — O471 False labor at or after 37 completed weeks of gestation: Secondary | ICD-10-CM | POA: Insufficient documentation

## 2022-04-29 ENCOUNTER — Other Ambulatory Visit: Payer: Self-pay | Admitting: Certified Nurse Midwife

## 2022-04-29 ENCOUNTER — Other Ambulatory Visit: Payer: Self-pay

## 2022-04-29 ENCOUNTER — Encounter: Payer: Self-pay | Admitting: Obstetrics and Gynecology

## 2022-04-29 DIAGNOSIS — O24419 Gestational diabetes mellitus in pregnancy, unspecified control: Secondary | ICD-10-CM | POA: Diagnosis not present

## 2022-04-29 DIAGNOSIS — Z3490 Encounter for supervision of normal pregnancy, unspecified, unspecified trimester: Secondary | ICD-10-CM

## 2022-04-29 DIAGNOSIS — Z3A37 37 weeks gestation of pregnancy: Secondary | ICD-10-CM | POA: Diagnosis not present

## 2022-04-29 DIAGNOSIS — Z79899 Other long term (current) drug therapy: Secondary | ICD-10-CM | POA: Diagnosis not present

## 2022-04-29 DIAGNOSIS — O99333 Smoking (tobacco) complicating pregnancy, third trimester: Secondary | ICD-10-CM | POA: Diagnosis not present

## 2022-04-29 DIAGNOSIS — O4193X0 Disorder of amniotic fluid and membranes, unspecified, third trimester, not applicable or unspecified: Secondary | ICD-10-CM | POA: Diagnosis present

## 2022-04-29 DIAGNOSIS — O471 False labor at or after 37 completed weeks of gestation: Secondary | ICD-10-CM | POA: Diagnosis not present

## 2022-04-29 DIAGNOSIS — F17210 Nicotine dependence, cigarettes, uncomplicated: Secondary | ICD-10-CM | POA: Diagnosis not present

## 2022-04-29 DIAGNOSIS — O24429 Gestational diabetes mellitus in childbirth, unspecified control: Secondary | ICD-10-CM | POA: Diagnosis not present

## 2022-04-29 DIAGNOSIS — O4290 Premature rupture of membranes, unspecified as to length of time between rupture and onset of labor, unspecified weeks of gestation: Secondary | ICD-10-CM | POA: Diagnosis present

## 2022-04-29 LAB — URINALYSIS, COMPLETE (UACMP) WITH MICROSCOPIC
Bilirubin Urine: NEGATIVE
Glucose, UA: NEGATIVE mg/dL
Hgb urine dipstick: NEGATIVE
Ketones, ur: NEGATIVE mg/dL
Nitrite: NEGATIVE
Protein, ur: NEGATIVE mg/dL
Specific Gravity, Urine: 1.017 (ref 1.005–1.030)
pH: 6 (ref 5.0–8.0)

## 2022-04-29 LAB — URINE DRUG SCREEN, QUALITATIVE (ARMC ONLY)
Amphetamines, Ur Screen: NOT DETECTED
Barbiturates, Ur Screen: NOT DETECTED
Benzodiazepine, Ur Scrn: NOT DETECTED
Cannabinoid 50 Ng, Ur ~~LOC~~: NOT DETECTED
Cocaine Metabolite,Ur ~~LOC~~: NOT DETECTED
MDMA (Ecstasy)Ur Screen: NOT DETECTED
Methadone Scn, Ur: NOT DETECTED
Opiate, Ur Screen: NOT DETECTED
Phencyclidine (PCP) Ur S: NOT DETECTED
Tricyclic, Ur Screen: NOT DETECTED

## 2022-04-29 LAB — WET PREP, GENITAL
Clue Cells Wet Prep HPF POC: NONE SEEN
Sperm: NONE SEEN
Trich, Wet Prep: NONE SEEN
WBC, Wet Prep HPF POC: <10 (ref <10)
Yeast Wet Prep HPF POC: NONE SEEN

## 2022-04-29 LAB — RUPTURE OF MEMBRANE (ROM)PLUS: Rom Plus: NEGATIVE

## 2022-04-29 LAB — GROUP B STREP BY PCR: Group B strep by PCR: NEGATIVE

## 2022-04-29 MED ORDER — ACETAMINOPHEN 325 MG PO TABS: 650.0000 mg | ORAL_TABLET | ORAL | Status: DC | PRN

## 2022-04-29 MED ORDER — PRENATAL MULTIVITAMIN CH: 1.0000 | ORAL_TABLET | Freq: Every day | ORAL | Status: DC

## 2022-04-29 MED ORDER — DOCUSATE SODIUM 100 MG PO CAPS: 100.0000 mg | ORAL_CAPSULE | Freq: Every day | ORAL | Status: DC

## 2022-04-29 MED ORDER — CALCIUM CARBONATE ANTACID 500 MG PO CHEW: 2.0000 | CHEWABLE_TABLET | ORAL | Status: DC | PRN

## 2022-04-29 MED ORDER — ZOLPIDEM TARTRATE 5 MG PO TABS: 5.0000 mg | ORAL_TABLET | Freq: Every evening | ORAL | Status: DC | PRN

## 2022-04-29 NOTE — Discharge Summary (Signed)
Laurie Oliver is a 36 y.o. female. She is at 83w4dgestation. Patient's last menstrual period was 08/09/2021 (within days). Estimated Date of Delivery: 05/16/22  [facility]  Chief complaint: c/o leaking of fluid x 1 week and uterine contractions  HPI: Laurie Oliver to L&D with complaints of c/o leaking of fluid x 1 week and uterine contractions  Factors complicating pregnancy: Hx of substance abuse GDM Smoking tobacco in pregnancy Hx of IUGR Hx of Hep C-Has completed treatment 2018-2019 Pos screening, neg quant PCR  S: Resting comfortably. no CTX, no VB.no LOF,  Active fetal movement.   Maternal Medical History:  Past Medical Hx:  has a past medical history of Alcohol use affecting pregnancy in first trimester (04/21/2019), Bacterial vaginosis, Bartholin's gland abscess, Heart murmur, and Hepatitis C.    Past Surgical Hx:  has a past surgical history that includes  Barholins gland abscess drainage.   Allergies  Allergen Reactions   Motrin [Ibuprofen] Nausea And Vomiting   Aspirin Swelling   Benzalkonium Chloride Swelling   Neosporin [Neomycin-Bacitracin Zn-Polymyx] Swelling     Prior to Admission medications   Medication Sig Start Date End Date Taking? Authorizing Provider  buprenorphine (SUBUTEX) 8 MG SUBL SL tablet Place 4 mg under the tongue 2 (two) times daily.    Yes [provider]  NARCAN 4 MG/0.1ML LIQD nasal spray kit 1 spray as directed. 07/30/21   [provider]  nicotine (NICODERM CQ) 14 mg/24hr patch Place 1 patch (14 mg total) onto the skin daily. 10/10/21   GRod Can CNM  Prenat-Fe Poly-Methfol-FA-DHA (VITAFOL FE+) 90-0.6-0.4-200 MG CAPS Take 1 tablet by mouth daily. 10/10/21   GRod Can CNM    Social History: She  reports that she has been smoking cigarettes. She has been smoking an average of .5 packs per day. She has never used smokeless tobacco. She reports current alcohol use. She reports that  she does not currently use drugs after having used the following drugs: Other-see comments.  Family History: family history includes Heart disease in her maternal grandfather; Hypertension in her maternal grandfather and maternal grandmother; Lung cancer in her paternal grandmother; Ovarian cancer in her mother. ,no history of gyn cancers   Review of Systems: A full review of systems was performed and negative except as noted in the HPI.     Pertinent Results:  Prenatal Labs: Blood type/Rh O POS   Antibody screen Negative    Rubella immune    Varicella Immune  RPR   NR  HBsAg  neg  Hep C NR   HIV   NR  GC neg  Chlamydia neg  Genetic screening cfDNA negative   1 hour GTT 138  3 hour GTT 82,164,171,152  GBS   pending    O:  BP 132/89   Pulse 97   Temp 98.7 F (37.1 C) (Oral)   Resp 16   LMP 08/09/2021 (Within Days)  Results for orders placed or performed during the hospital encounter of 04/28/22 (from the past 48 hour(s))  Wet prep, genital   Collection Time: 04/29/22 12:21 AM   Specimen: Urine, Clean Catch  Result Value Ref Range   Yeast Wet Prep HPF POC NONE SEEN NONE SEEN   Trich, Wet Prep NONE SEEN NONE SEEN   Clue Cells Wet Prep HPF POC NONE SEEN NONE SEEN   WBC, Wet Prep HPF POC <10 <10   Sperm NONE SEEN   Urinalysis, Complete w Microscopic Urine, Clean Catch  Collection Time: 04/29/22 12:21 AM  Result Value Ref Range   Color, Urine YELLOW (A) YELLOW   APPearance HAZY (A) CLEAR   Specific Gravity, Urine 1.017 1.005 - 1.030   pH 6.0 5.0 - 8.0   Glucose, UA NEGATIVE NEGATIVE mg/dL   Hgb urine dipstick NEGATIVE NEGATIVE   Bilirubin Urine NEGATIVE NEGATIVE   Ketones, ur NEGATIVE NEGATIVE mg/dL   Protein, ur NEGATIVE NEGATIVE mg/dL   Nitrite NEGATIVE NEGATIVE   Leukocytes,Ua TRACE (A) NEGATIVE   RBC / HPF 0-5 0 - 5 RBC/hpf   WBC, UA 0-5 0 - 5 WBC/hpf   Bacteria, UA RARE (A) NONE SEEN   Squamous Epithelial / LPF 0-5 0 - 5   Mucus PRESENT   Urine Drug  Screen, Qualitative ([facility] only)   Collection Time: 04/29/22 12:21 AM  Result Value Ref Range   Tricyclic, Ur Screen NONE DETECTED NONE DETECTED   Amphetamines, Ur Screen NONE DETECTED NONE DETECTED   MDMA (Ecstasy)Ur Screen NONE DETECTED NONE DETECTED   Cocaine Metabolite,Ur [place] NONE DETECTED NONE DETECTED   Opiate, Ur Screen NONE DETECTED NONE DETECTED   Phencyclidine (PCP) Ur S NONE DETECTED NONE DETECTED   Cannabinoid 50 Ng, Ur [place] NONE DETECTED NONE DETECTED   Barbiturates, Ur Screen NONE DETECTED NONE DETECTED   Benzodiazepine, Ur Scrn NONE DETECTED NONE DETECTED   Methadone Scn, Ur NONE DETECTED NONE DETECTED  ROM Plus ([facility] only)   Collection Time: 04/29/22 12:21 AM  Result Value Ref Range   Rom Plus NEGATIVE      Constitutional: NAD, AAOx3  HE/ENT: extraocular movements grossly intact, moist mucous membranes CV: RRR PULM: nl respiratory effort Abd: gravid, non-tender, non-distended, soft  Ext: Non-tender, Nonedmeatous Psych: mood appropriate, speech normal Pelvic : deferred SVE:     Fetal Monitor: Baseline: 130 bpm Variability: moderate Accels: Present Decels: none Toco: irregular cramping  Category: I NST: reactive   Assessment: 36 y.o. 63w4dhere for antenatal surveillance during pregnancy.  Principle diagnosis: Leakage of amniotic fluid   Plan: Labor: not present. ROM plus- neg Wet prep- neg UDS- neg UA- neg  NST reviewed - reactive tracing  Fetal Wellbeing: Reassuring Cat 1 tracing. D/c home stable, precautions reviewed, follow-up as scheduled.   ----- FAvelino Leeds CNM Certified Nurse Midwife [facility]

## 2022-04-29 NOTE — Progress Notes (Signed)
[id number] at [redacted]w[redacted]d, LMP c/w early [place] at [redacted]w[redacted]d.  Scheduled for induction of labor for uncontrolled GDM on 04/30/2022.   Prenatal provider: [facility] Pregnancy complicated by: Suboxone use GDM, uncontrolled History of IUGR History of Hep C (received treatment, now negative) Smoking in pregnancy (now using patch)  Prenatal Labs: Blood type/Rh O pos  Antibody screen neg  Rubella Immune  Varicella Immune  RPR NR  HBsAg Neg  HIV NR  GC neg  Chlamydia neg  Genetic screening cfDNA negative   1 hour GTT 138  3 hour GTT 82,164,171,152   GBS negative   Tdap: 03/19/22 Flu: 06/2021 Contraception: undecided Feeding preference: TBD  ____ Janyce Llanos, CNM  Certified Nurse Midwife [place]  [facility]

## 2022-04-29 NOTE — OB Triage Note (Signed)
Pt is G4P2 at 37.4 weeks with c/o leaking of fluid x 1 week and UCs. Dr Jean Rosenthal told her to be seen at some point. Pt is scattered and irregular in speech and attention. Pt states some elevated BP and is a GDM who "just tries to rest when it is high" Sometimes she says it is also low and she drinks juice. +FM 135

## 2022-04-30 ENCOUNTER — Inpatient Hospital Stay: Payer: Medicaid Other | Admitting: Anesthesiology

## 2022-04-30 ENCOUNTER — Other Ambulatory Visit: Payer: Self-pay

## 2022-04-30 ENCOUNTER — Inpatient Hospital Stay
Admission: EM | Admit: 2022-04-30 | Discharge: 2022-05-04 | DRG: 806 | Disposition: A | Discharge status: Home Health | Payer: Medicaid Other | Attending: Obstetrics and Gynecology | Admitting: Obstetrics and Gynecology

## 2022-04-30 ENCOUNTER — Encounter: Payer: Self-pay | Admitting: Obstetrics and Gynecology

## 2022-04-30 DIAGNOSIS — Z3A37 37 weeks gestation of pregnancy: Secondary | ICD-10-CM

## 2022-04-30 DIAGNOSIS — O99324 Drug use complicating childbirth: Secondary | ICD-10-CM | POA: Diagnosis present

## 2022-04-30 DIAGNOSIS — O99893 Other specified diseases and conditions complicating puerperium: Secondary | ICD-10-CM | POA: Diagnosis not present

## 2022-04-30 DIAGNOSIS — O24429 Gestational diabetes mellitus in childbirth, unspecified control: Secondary | ICD-10-CM | POA: Diagnosis present

## 2022-04-30 DIAGNOSIS — R339 Retention of urine, unspecified: Secondary | ICD-10-CM | POA: Diagnosis not present

## 2022-04-30 DIAGNOSIS — Z8759 Personal history of other complications of pregnancy, childbirth and the puerperium: Principal | ICD-10-CM

## 2022-04-30 DIAGNOSIS — O99334 Smoking (tobacco) complicating childbirth: Secondary | ICD-10-CM | POA: Diagnosis present

## 2022-04-30 DIAGNOSIS — R29898 Other symptoms and signs involving the musculoskeletal system: Secondary | ICD-10-CM | POA: Diagnosis not present

## 2022-04-30 DIAGNOSIS — F17210 Nicotine dependence, cigarettes, uncomplicated: Secondary | ICD-10-CM | POA: Diagnosis present

## 2022-04-30 DIAGNOSIS — Z3490 Encounter for supervision of normal pregnancy, unspecified, unspecified trimester: Secondary | ICD-10-CM | POA: Diagnosis present

## 2022-04-30 DIAGNOSIS — F1910 Other psychoactive substance abuse, uncomplicated: Secondary | ICD-10-CM | POA: Diagnosis present

## 2022-04-30 DIAGNOSIS — R200 Anesthesia of skin: Secondary | ICD-10-CM | POA: Diagnosis not present

## 2022-04-30 DIAGNOSIS — S7410XD Injury of femoral nerve at hip and thigh level, unspecified leg, subsequent encounter: Secondary | ICD-10-CM

## 2022-04-30 DIAGNOSIS — R531 Weakness: Secondary | ICD-10-CM | POA: Diagnosis not present

## 2022-04-30 LAB — CBC
HCT: 38.5 % (ref 36.0–46.0)
Hemoglobin: 13.2 g/dL (ref 12.0–15.0)
MCH: 32.8 pg (ref 26.0–34.0)
MCHC: 34.3 g/dL (ref 30.0–36.0)
MCV: 95.5 fL (ref 80.0–100.0)
Platelets: 120 10*3/uL — ABNORMAL LOW (ref 150–400)
RBC: 4.03 MIL/uL (ref 3.87–5.11)
RDW: 13.2 % (ref 11.5–15.5)
WBC: 6.4 10*3/uL (ref 4.0–10.5)
nRBC: 0 % (ref 0.0–0.2)

## 2022-04-30 LAB — RPR: RPR Ser Ql: NONREACTIVE

## 2022-04-30 LAB — GLUCOSE, CAPILLARY
Glucose-Capillary: 67 mg/dL — ABNORMAL LOW (ref 70–99)
Glucose-Capillary: 159 mg/dL — ABNORMAL HIGH (ref 70–99)
Glucose-Capillary: 95 mg/dL (ref 70–99)
Glucose-Capillary: 87 mg/dL (ref 70–99)
Glucose-Capillary: 90 mg/dL (ref 70–99)
Glucose-Capillary: 70 mg/dL (ref 70–99)

## 2022-04-30 LAB — TYPE AND SCREEN
ABO/RH(D): O POS
Antibody Screen: NEGATIVE

## 2022-04-30 MED ORDER — OXYCODONE HCL 5 MG PO TABS
5.0000 mg | ORAL_TABLET | ORAL | Status: DC | PRN
  Administered 2022-05-01 – 2022-05-03 (×6): 5 mg via ORAL
  Filled 2022-04-30 (×6): qty 1

## 2022-04-30 MED ORDER — ONDANSETRON HCL 4 MG/2ML IJ SOLN: 4.0000 mg | INTRAMUSCULAR | Status: DC | PRN

## 2022-04-30 MED ORDER — SIMETHICONE 80 MG PO CHEW: 80.0000 mg | CHEWABLE_TABLET | ORAL | Status: DC | PRN

## 2022-04-30 MED ORDER — COCONUT OIL OIL: 1.0000 | TOPICAL_OIL | Status: DC | PRN

## 2022-04-30 MED ORDER — LACTATED RINGERS IV SOLN: 500.0000 mL | INTRAVENOUS | Status: DC | PRN

## 2022-04-30 MED ORDER — ACETAMINOPHEN 500 MG PO TABS
1000.0000 mg | ORAL_TABLET | Freq: Four times a day (QID) | ORAL | Status: DC | PRN
  Administered 2022-04-30 – 2022-05-04 (×10): 1000 mg via ORAL
  Filled 2022-04-30 (×10): qty 2

## 2022-04-30 MED ORDER — WITCH HAZEL-GLYCERIN EX PADS
1.0000 | MEDICATED_PAD | CUTANEOUS | Status: DC | PRN
  Administered 2022-04-30: 1 via TOPICAL
  Filled 2022-04-30 (×2): qty 100

## 2022-04-30 MED ORDER — SODIUM CHLORIDE 0.9% FLUSH: 3.0000 mL | INTRAVENOUS | Status: DC | PRN

## 2022-04-30 MED ORDER — BENZOCAINE-MENTHOL 20-0.5 % EX AERO
1.0000 | INHALATION_SPRAY | CUTANEOUS | Status: DC | PRN
  Administered 2022-04-30: 1 via TOPICAL
  Filled 2022-04-30 (×2): qty 56

## 2022-04-30 MED ORDER — OXYTOCIN 10 UNIT/ML IJ SOLN
INTRAMUSCULAR | Status: AC
  Filled 2022-04-30: qty 2

## 2022-04-30 MED ORDER — ZOLPIDEM TARTRATE 5 MG PO TABS: 5.0000 mg | ORAL_TABLET | Freq: Every evening | ORAL | Status: DC | PRN

## 2022-04-30 MED ORDER — FENTANYL-BUPIVACAINE-NACL 0.5-0.125-0.9 MG/250ML-% EP SOLN
12.0000 mL/h | EPIDURAL | Status: DC | PRN
  Filled 2022-04-30: qty 250

## 2022-04-30 MED ORDER — CARBOPROST TROMETHAMINE 250 MCG/ML IM SOLN
INTRAMUSCULAR | Status: AC
  Filled 2022-04-30: qty 1

## 2022-04-30 MED ORDER — LIDOCAINE HCL (PF) 1 % IJ SOLN: 30.0000 mL | INTRAMUSCULAR | Status: DC | PRN

## 2022-04-30 MED ORDER — MISOPROSTOL 200 MCG PO TABS
ORAL_TABLET | ORAL | Status: AC
  Administered 2022-04-30: 200 ug via RECTAL
  Filled 2022-04-30: qty 4

## 2022-04-30 MED ORDER — LACTATED RINGERS IV SOLN
500.0000 mL | Freq: Once | INTRAVENOUS | Status: AC
  Administered 2022-04-30: 500 mL via INTRAVENOUS

## 2022-04-30 MED ORDER — AMMONIA AROMATIC IN INHA
RESPIRATORY_TRACT | Status: AC
  Filled 2022-04-30: qty 10

## 2022-04-30 MED ORDER — PRENATAL MULTIVITAMIN CH
1.0000 | ORAL_TABLET | Freq: Every day | ORAL | Status: DC
  Administered 2022-05-01 – 2022-05-04 (×4): 1 via ORAL
  Filled 2022-04-30 (×3): qty 1

## 2022-04-30 MED ORDER — FENTANYL-BUPIVACAINE-NACL 0.5-0.125-0.9 MG/250ML-% EP SOLN
EPIDURAL | Status: DC | PRN
  Administered 2022-04-30: 12 mL/h via EPIDURAL

## 2022-04-30 MED ORDER — NICOTINE 21 MG/24HR TD PT24
21.0000 mg | MEDICATED_PATCH | Freq: Every day | TRANSDERMAL | Status: DC
  Administered 2022-04-30 – 2022-05-04 (×5): 21 mg via TRANSDERMAL
  Filled 2022-04-30 (×5): qty 1

## 2022-04-30 MED ORDER — LIDOCAINE-EPINEPHRINE (PF) 1.5 %-1:200000 IJ SOLN
INTRAMUSCULAR | Status: DC | PRN
  Administered 2022-04-30: 3 mL via PERINEURAL

## 2022-04-30 MED ORDER — METHYLERGONOVINE MALEATE 0.2 MG/ML IJ SOLN
INTRAMUSCULAR | Status: AC
  Filled 2022-04-30: qty 1

## 2022-04-30 MED ORDER — MEDROXYPROGESTERONE ACETATE 150 MG/ML IM SUSP
150.0000 mg | INTRAMUSCULAR | Status: AC | PRN
  Administered 2022-05-04: 150 mg via INTRAMUSCULAR
  Filled 2022-04-30: qty 1

## 2022-04-30 MED ORDER — ONDANSETRON HCL 4 MG PO TABS: 4.0000 mg | ORAL_TABLET | ORAL | Status: DC | PRN

## 2022-04-30 MED ORDER — SODIUM CHLORIDE 0.9 % IV SOLN: 250.0000 mL | INTRAVENOUS | Status: DC | PRN

## 2022-04-30 MED ORDER — EPHEDRINE 5 MG/ML INJ
10.0000 mg | INTRAVENOUS | Status: DC | PRN
Start: 2022-04-30 — End: 2022-04-30

## 2022-04-30 MED ORDER — TERBUTALINE SULFATE 1 MG/ML IJ SOLN: 0.2500 mg | Freq: Once | INTRAMUSCULAR | Status: DC | PRN

## 2022-04-30 MED ORDER — ONDANSETRON HCL 4 MG/2ML IJ SOLN: 4.0000 mg | Freq: Four times a day (QID) | INTRAMUSCULAR | Status: DC | PRN

## 2022-04-30 MED ORDER — OXYTOCIN-SODIUM CHLORIDE 30-0.9 UT/500ML-% IV SOLN
1.0000 m[IU]/min | INTRAVENOUS | Status: DC
  Administered 2022-04-30: 2 m[IU]/min via INTRAVENOUS
  Filled 2022-04-30: qty 500

## 2022-04-30 MED ORDER — DIPHENHYDRAMINE HCL 25 MG PO CAPS: 25.0000 mg | ORAL_CAPSULE | Freq: Four times a day (QID) | ORAL | Status: DC | PRN

## 2022-04-30 MED ORDER — MISOPROSTOL 200 MCG PO TABS
ORAL_TABLET | ORAL | Status: AC
  Filled 2022-04-30: qty 5

## 2022-04-30 MED ORDER — SOD CITRATE-CITRIC ACID 500-334 MG/5ML PO SOLN: 30.0000 mL | ORAL | Status: DC | PRN

## 2022-04-30 MED ORDER — BISACODYL 10 MG RE SUPP: 10.0000 mg | Freq: Every day | RECTAL | Status: DC | PRN

## 2022-04-30 MED ORDER — SODIUM CHLORIDE 0.9% FLUSH: 3.0000 mL | Freq: Two times a day (BID) | INTRAVENOUS | Status: DC

## 2022-04-30 MED ORDER — PHENYLEPHRINE 80 MCG/ML (10ML) SYRINGE FOR IV PUSH (FOR BLOOD PRESSURE SUPPORT)
80.0000 ug | PREFILLED_SYRINGE | INTRAVENOUS | Status: DC | PRN
Start: 2022-04-30 — End: 2022-04-30

## 2022-04-30 MED ORDER — OXYTOCIN BOLUS FROM INFUSION
333.0000 mL | Freq: Once | INTRAVENOUS | Status: AC
  Administered 2022-04-30: 333 mL via INTRAVENOUS

## 2022-04-30 MED ORDER — LIDOCAINE HCL (PF) 1 % IJ SOLN
INTRAMUSCULAR | Status: AC
  Filled 2022-04-30: qty 30

## 2022-04-30 MED ORDER — LACTATED RINGERS IV SOLN: INTRAVENOUS | Status: DC

## 2022-04-30 MED ORDER — EPHEDRINE 5 MG/ML INJ: 10.0000 mg | INTRAVENOUS | Status: DC | PRN

## 2022-04-30 MED ORDER — BUPRENORPHINE HCL 8 MG SL SUBL
8.0000 mg | SUBLINGUAL_TABLET | Freq: Two times a day (BID) | SUBLINGUAL | Status: DC
  Filled 2022-04-30: qty 1

## 2022-04-30 MED ORDER — ACETAMINOPHEN 325 MG PO TABS: 650.0000 mg | ORAL_TABLET | ORAL | Status: DC | PRN

## 2022-04-30 MED ORDER — OXYTOCIN-SODIUM CHLORIDE 30-0.9 UT/500ML-% IV SOLN
2.5000 [IU]/h | INTRAVENOUS | Status: DC
  Administered 2022-04-30: 2.5 [IU]/h via INTRAVENOUS

## 2022-04-30 MED ORDER — DIBUCAINE (PERIANAL) 1 % EX OINT
1.0000 | TOPICAL_OINTMENT | CUTANEOUS | Status: DC | PRN
  Administered 2022-04-30: 1 via RECTAL
  Filled 2022-04-30 (×2): qty 28

## 2022-04-30 MED ORDER — LIDOCAINE HCL (PF) 1 % IJ SOLN
INTRAMUSCULAR | Status: DC | PRN
  Administered 2022-04-30: 2 mL

## 2022-04-30 MED ORDER — BUPIVACAINE HCL (PF) 0.25 % IJ SOLN
INTRAMUSCULAR | Status: DC | PRN
  Administered 2022-04-30 (×2): 4 mL via EPIDURAL

## 2022-04-30 MED ORDER — DIPHENHYDRAMINE HCL 50 MG/ML IJ SOLN: 12.5000 mg | INTRAMUSCULAR | Status: DC | PRN

## 2022-04-30 MED ORDER — SENNOSIDES-DOCUSATE SODIUM 8.6-50 MG PO TABS
2.0000 | ORAL_TABLET | ORAL | Status: DC
  Administered 2022-04-30 – 2022-05-04 (×4): 2 via ORAL
  Filled 2022-04-30 (×4): qty 2

## 2022-04-30 MED ORDER — FENTANYL-BUPIVACAINE-NACL 0.5-0.125-0.9 MG/250ML-% EP SOLN
EPIDURAL | Status: AC
  Filled 2022-04-30: qty 250

## 2022-04-30 NOTE — Discharge Summary (Signed)
Obstetrical Discharge Summary  Patient Name: Laurie Oliver DOB: August 31, 1986 MRN: [id number]  Date of Admission: 04/30/2022 Date of Delivery: 4/74/25 Delivered ZD:GLOVFIE Bobette Mo, CNM  Date of Discharge: 05/04/2022  Primary OB: [facility] PPI:[id number]'Y last menstrual period was 08/09/2021 (within days). EDC Estimated Date of Delivery: 05/16/22 Gestational Age at Delivery: [redacted]w[redacted]d  Antepartum complications:  Suboxone use GDM, uncontrolled History of IUGR History of Hep C (received treatment, now negative) Smoking in pregnancy (now using patch)   Admitting Diagnosis: Encounter for induction of labor [Z34.90]  Secondary Diagnosis: Patient Active Problem List   Diagnosis Date Noted   Encounter for induction of labor 04/30/2022   Leakage of amniotic fluid 04/29/2022   Smoking (tobacco) complicating pregnancy, second trimester 01/08/2022   Encounter for supervision of normal pregnancy in third trimester 11/03/2021   Supervision of normal pregnancy 10/10/2021   History of prior pregnancy with IUGR newborn 10/10/2021   Gastric reflux 06/24/2021   Attention and concentration deficit 11/22/2020   Pregnancy complicated by subutex maintenance, antepartum ([place] 04/21/2019   Smoking (tobacco) complicating pregnancy, unspecified trimester 11/02/2018   History of hepatitis C 01/14/2018   History of substance abuse ([place] 01/04/2017    Discharge Diagnosis: Term Pregnancy Delivered and GDM uncontrolled       Augmentation: AROM and Pitocin Complications: None Intrapartum complications/course: [place] to [place]&D with uncontrolled GDM. She was 3/50/-3. She progressed  to C/C/+2 with a spontaneous urge to push.  She pushed  effectively over approximately 8 minutes for a spontaneous vaginal birth.  Delivery of a viable baby boy on 04/30/22 at 2004  Delivery Type: spontaneous vaginal delivery Anesthesia: epidural anesthesia Placenta: spontaneous To Pathology: No  Laceration:  none Episiotomy: none Newborn Data: Live born child  Birth Weight:   APGAR: ,   Newborn Delivery   Birth date/time: 04/30/2022 20:04:00 Delivery type: Vaginal, Spontaneous      Postpartum Procedures:  eval by Anesthesia and Neuro due to persistent numbness. DC home with PT and walker assistance.   Edinburgh:     05/01/2022   10:18 AM 08/11/2019    2:22 PM 05/09/2019    6:04 AM  Edinburgh Postnatal Depression Scale Screening Tool  I have been able to laugh and see the funny side of things. 0 0 0  I have looked forward with enjoyment to things. 0 0 0  I have blamed myself unnecessarily when things went wrong. 0 2 2  I have been anxious or worried for no good reason. 2 0 2  I have felt scared or panicky for no good reason. 0 1 0  Things have been getting on top of me. 2 0 0  I have been so unhappy that I have had difficulty sleeping. 0 1 1  I have felt sad or miserable. 3 0 0  I have been so unhappy that I have been crying. 1 1 1   The thought of harming myself has occurred to me. 0 1 0  Edinburgh Postnatal Depression Scale Total 8 6 6      Post partum course:  Patient had an uncomplicated postpartum course.  By time of discharge on PPD#4, her pain was controlled on oral pain medications; she had appropriate lochia and was ambulating, voiding without difficulty and tolerating regular diet.  She was deemed stable for discharge to home.    Discharge Physical Exam:  BP 139/83   Pulse 74   Temp 98 F (36.7 C) (Oral)   Resp 20   Ht 5' (1.524  m)   Wt 60.8 kg   LMP 08/09/2021 (Within Days)   SpO2 99%   Breastfeeding Unknown   BMI 26.17 kg/m   General: NAD CV: RRR Pulm: CTABL, nl effort ABD: s/nd/nt, fundus firm and below the umbilicus Lochia: moderate Perineum:minimal edema/intact DVT Evaluation: LE non-ttp, no evidence of DVT on exam.  Hemoglobin  Date Value Ref Range Status  05/01/2022 11.5 (L) 12.0 - 15.0 g/dL Final  02/22/2019 11.8 11.1 - 15.9 g/dL Final   HCT   Date Value Ref Range Status  05/01/2022 33.6 (L) 36.0 - 46.0 % Final   Hematocrit  Date Value Ref Range Status  02/22/2019 34.3 34.0 - 46.6 % Final    Risk assessment for postpartum VTE and prophylactic treatment: Very high risk factors: None High risk factors: None Moderate risk factors: None  Postpartum VTE prophylaxis with LMWH not indicated  Disposition: stable, discharge to home. Baby Feeding: breast and formula feeding Baby Disposition: home with mom  Rh Immune globulin indicated: No Rubella vaccine given: was not indicated Varivax vaccine given: was not indicated Flu vaccine given in AP setting: Yes  Tdap vaccine given in AP setting: Yes   Contraception: Depo-Provera  Prenatal Labs:  Blood type/Rh O pos  Antibody screen neg  Rubella Immune  Varicella Immune  RPR NR  HBsAg Neg  HIV NR  GC neg  Chlamydia neg  Genetic screening cfDNA negative   1 hour GTT 138  3 hour GTT 82,164,171,152   GBS negative    Plan:  Laurie Oliver was discharged to home in good condition. Follow-up appointment with delivering provider in 6 weeks.  Discharge Medications: Allergies as of 05/04/2022       Reactions   Motrin [ibuprofen] Nausea And Vomiting   Aspirin Swelling   Benzalkonium Chloride Swelling   Neosporin [neomycin-bacitracin Zn-polymyx] Swelling        Medication List     STOP taking these medications    nicotine 14 mg/24hr patch Commonly known as: Nicoderm CQ Replaced by: nicotine 21 mg/24hr patch       TAKE these medications    acetaminophen 500 MG tablet Commonly known as: TYLENOL Take 2 tablets (1,000 mg total) by mouth every 6 (six) hours as needed (for pain scale < 4).   benzocaine-Menthol 20-0.5 % Aero Commonly known as: DERMOPLAST Apply 1 Application topically as needed for irritation (perineal discomfort).   buprenorphine 8 MG Subl SL tablet Commonly known as: SUBUTEX Place 4 mg under the tongue 2 (two) times daily.    diphenhydrAMINE 25 mg capsule Commonly known as: BENADRYL Take 1 capsule (25 mg total) by mouth every 6 (six) hours as needed for itching.   gabapentin 300 MG capsule Commonly known as: NEURONTIN Take 1 capsule (300 mg total) by mouth 3 (three) times daily.   Narcan 4 MG/0.1ML Liqd nasal spray kit Generic drug: naloxone 1 spray as directed.   nicotine 21 mg/24hr patch Commonly known as: NICODERM CQ - dosed in mg/24 hours Place 1 patch (21 mg total) onto the skin daily for 14 days. Start taking on: May 05, 2022 Replaces: nicotine 14 mg/24hr patch   ondansetron 4 MG tablet Commonly known as: ZOFRAN Take 1 tablet (4 mg total) by mouth every 8 (eight) hours as needed for nausea (if no iv access).   senna-docusate 8.6-50 MG tablet Commonly known as: Senokot-S Take 2 tablets by mouth daily.   Vitafol FE+ 90-0.6-0.4-200 MG Caps Take 1 tablet by mouth daily.   witch hazel-glycerin  pad Commonly known as: TUCKS Apply 1 Application topically as needed for hemorrhoids (for pain).               Durable Medical Equipment  (From admission, onward)           Start     Ordered   05/03/22 1615  For home use only DME Walker rolling  Once       Question Answer Comment  Walker: With [place] Wheels   Patient needs a walker to treat with the following condition Muscle weakness (generalized)      05/03/22 1615             Follow-up Information     Avelino Leeds Arlyn Leak, CNM Follow up in 6 week(s).   Specialty: Obstetrics Why: PP and glucose test Contact information: [place] [id number] [place] Follow up in 6 week(s).   Contact information: [address] [place], [facility] Follow up.   Why: Call ASAP to setup physical therapy. Contact information: [place]  [phone]                 Signed: Francetta Found, CNM 05/04/2022 9:31 PM

## 2022-04-30 NOTE — Anesthesia Preprocedure Evaluation (Addendum)
Anesthesia Evaluation  Patient identified by MRN, date of birth, ID band Patient awake    Reviewed: Allergy & Precautions, H&P , NPO status , Patient's Chart, lab work & pertinent test results  Airway Mallampati: II   Neck ROM: full    Dental no notable dental hx.    Pulmonary Current Smoker,    Pulmonary exam normal        Cardiovascular Normal cardiovascular exam+ Valvular Problems/Murmurs      Neuro/Psych Suboxone use negative neurological ROS  negative psych ROS   GI/Hepatic negative GI ROS, (+) Hepatitis - (C s/p treatment, now negative)  Endo/Other  diabetes, Gestational  Renal/GU negative Renal ROS  negative genitourinary   Musculoskeletal   Abdominal   Peds  Hematology negative hematology ROS (+)   Anesthesia Other Findings   Reproductive/Obstetrics (+) Pregnancy                            Anesthesia Physical Anesthesia Plan  ASA: 3  Anesthesia Plan: Epidural   Post-op Pain Management:    Induction:   PONV Risk Score and Plan: 1 and Treatment may vary due to age or medical condition  Airway Management Planned:   Additional Equipment:   Intra-op Plan:   Post-operative Plan:   Informed Consent: I have reviewed the patients History and Physical, chart, labs and discussed the procedure including the risks, benefits and alternatives for the proposed anesthesia with the patient or authorized representative who has indicated his/her understanding and acceptance.       Plan Discussed with: Anesthesiologist and CRNA  Anesthesia Plan Comments:        Anesthesia Quick Evaluation

## 2022-04-30 NOTE — Progress Notes (Signed)
Labor Progress Note  Laurie Oliver is a 36 y.o. [id number] at [redacted]w[redacted]d by LMP admitted for induction of labor due to Gestational diabetes.  Subjective: resting in bed requesting an epidural  Objective: BP 102/60 (BP Location: Left Arm)   Pulse 76   Temp 98 F (36.7 C) (Oral)   Resp 18   Ht 5' (1.524 m)   Wt 60.8 kg   LMP 08/09/2021 (Within Days)   BMI 26.17 kg/m  Notable VS details:   Fetal Assessment: FHT:  FHR: 118 bpm, variability: moderate,  accelerations:  Present,  decelerations:  Absent Category/reactivity:  Category I UC:   regular, every 1-4 minutes SVE:   4.5/60/-1 Membrane status: AROM Amniotic color: Clear  Labs: Lab Results  Component Value Date   WBC 6.4 04/30/2022   HGB 13.2 04/30/2022   HCT 38.5 04/30/2022   MCV 95.5 04/30/2022   PLT 120 (L) 04/30/2022    Assessment / Plan: Induction of labor due to gestational diabetes,  progressing well on pitocin  Labor: Progressing normally and AROM performed Preeclampsia:  labs stable Fetal Wellbeing:  Category I Pain Control:  Epidural I/D:   GBS neg Anticipated MOD:  NSVD  Laurie Oliver, CNM 04/30/2022, 3:16 PM

## 2022-04-30 NOTE — H&P (Signed)
OB History & Physical   History of Present Illness:   Chief Complaint: IOL for uncontrolled GDM  HPI:  Laurie Oliver is a 36 y.o. [phone] female at [redacted]w[redacted]d Patient's last menstrual period was 08/09/2021 (within days)., consistent with [place]at 136w0dwith Estimated Date of Delivery: 05/16/22.  She presents to L&D for IOL for uncontrolled GDM  Reports active fetal movement  Contractions: every 2 to 5 minutes LOF/SROM: intact Vaginal bleeding: none  Factors complicating pregnancy:  Suboxone use GDM, uncontrolled History of IUGR History of Hep C (received treatment, now negative) Smoking in pregnancy (now using patch)  Patient Active Problem List   Diagnosis Date Noted   Encounter for induction of labor 04/30/2022   Leakage of amniotic fluid 04/29/2022   Smoking (tobacco) complicating pregnancy, second trimester 01/08/2022   Encounter for supervision of normal pregnancy in third trimester 11/03/2021   Supervision of normal pregnancy 10/10/2021   History of prior pregnancy with IUGR newborn 10/10/2021   Gastric reflux 06/24/2021   Attention and concentration deficit 11/22/2020   Pregnancy complicated by subutex maintenance, antepartum ([place]07/31/2020   Smoking (tobacco) complicating pregnancy, unspecified trimester 11/02/2018   History of hepatitis C 01/14/2018   History of substance abuse ([place]04/16/2018    Prenatal Transfer Tool  Maternal Diabetes: Yes:  Diabetes Type:  Diet controlled Genetic Screening: Normal Maternal Ultrasounds/Referrals: Normal Fetal Ultrasounds or other Referrals:  Other: lifestyles Maternal Substance Abuse:  Yes:  Type: Other: hx of substance abuse Significant Maternal Medications:  Meds include: Other: Suboxone 4-8 mg BID Significant Maternal Lab Results: None  Maternal Medical History:   Past Medical History:  Diagnosis Date   Alcohol use affecting pregnancy in first trimester 04/21/2019   Bacterial vaginosis    Bartholin's gland abscess     Heart murmur    Hepatitis C    treated-in remission    Past Surgical History:  Procedure Laterality Date    Barholins gland abscess drainage      Allergies  Allergen Reactions   Motrin [Ibuprofen] Nausea And Vomiting   Aspirin Swelling   Benzalkonium Chloride Swelling   Neosporin [Neomycin-Bacitracin Zn-Polymyx] Swelling    Prior to Admission medications   Medication Sig Start Date End Date Taking? Authorizing Provider  buprenorphine (SUBUTEX) 8 MG SUBL SL tablet Place 4 mg under the tongue 2 (two) times daily.    Yes [provider]  nicotine (NICODERM CQ) 14 mg/24hr patch Place 1 patch (14 mg total) onto the skin daily. 10/10/21  Yes GlRod CanCNM  Prenat-Fe Poly-Methfol-FA-DHA (VITAFOL FE+) 90-0.6-0.4-200 MG CAPS Take 1 tablet by mouth daily. 10/10/21  Yes GlRod CanCNM  NARCAN 4 MG/0.1ML LIQD nasal spray kit 1 spray as directed. Patient not taking: Reported on 04/30/2022 07/30/21   [provider]     [facility]  OB History  Gravida Para Term Preterm AB Living  _0 0 1 2  SAB IAB Ectopic Multiple Live Births  1 0 0 0 2    # Outcome Date GA Lbr Len/2nd Weight Sex Delivery Anes PTL Lv  4 Current           3 Term 05/08/19 [id number] g F Vag-Spont EPI  LIV     Name: JONTAMARI, REDWINE  Apgar1: 9  Apgar5: 9  2 SAB 2019          1 Term 04/07/15 [id number]:13 2230 g F Vag-Spont None  LIV  Birth Comments: fetal growth restriction     Apgar1: 8  Apgar5: 9     Social History: She  reports that she has been smoking cigarettes. She has been smoking an average of .5 packs per day. She has never used smokeless tobacco. She reports current alcohol use. She reports that she does not currently use drugs after having used the following drugs: Other-see comments.  Family History: family history includes Heart disease in her maternal grandfather; Hypertension in her maternal grandfather and maternal grandmother;  Lung cancer in her paternal grandmother; Ovarian cancer in her mother.   Review of Systems: A full review of systems was performed and negative except as noted in the HPI.     Physical Exam:  Vital Signs: BP 132/81 (BP Location: Left Arm)   Pulse 75   Temp 98.5 F (36.9 C) (Oral)   Resp 14   Ht 5' (1.524 m)   Wt 60.8 kg   LMP 08/09/2021 (Within Days)   BMI 26.17 kg/m   General: no acute distress.  HEENT: normocephalic, atraumatic Heart: regular rate & rhythm Lungs: normal respiratory effort Abdomen: soft, gravid, non-tender;  EFW: 6 lbs Pelvic:   External: Normal external female genitalia  Cervix: Dilation: 3 / Effacement (%): 50 / Station: Ballotable    Extremities: non-tender, symmetric, no edema bilaterally.  DTRs: +2  Neurologic: Alert & oriented x 3.    Results for orders placed or performed during the hospital encounter of 04/30/22 (from the past 24 hour(s))  CBC     Status: Abnormal   Collection Time: 04/30/22  7:09 AM  Result Value Ref Range   WBC 6.4 4.0 - 10.5 K/uL   RBC 4.03 3.87 - 5.11 MIL/uL   Hemoglobin 13.2 12.0 - 15.0 g/dL   HCT 38.5 36.0 - 46.0 %   MCV 95.5 80.0 - 100.0 fL   MCH 32.8 26.0 - 34.0 pg   MCHC 34.3 30.0 - 36.0 g/dL   RDW 13.2 11.5 - 15.5 %   Platelets 120 (L) 150 - 400 K/uL   nRBC 0.0 0.0 - 0.2 %  Type and screen     Status: None   Collection Time: 04/30/22  7:09 AM  Result Value Ref Range   ABO/RH(D) O POS    Antibody Screen NEG    Sample Expiration      05/03/2022,2359 Performed at [facility], [address] [place], [place] [address]   Glucose, capillary     Status: Abnormal   Collection Time: 04/30/22  8:05 AM  Result Value Ref Range   Glucose-Capillary 67 (L) 70 - 99 mg/dL  Glucose, capillary     Status: Abnormal   Collection Time: 04/30/22 10:02 AM  Result Value Ref Range   Glucose-Capillary 159 (H) 70 - 99 mg/dL    Pertinent Results:  Prenatal Labs: Blood type/Rh O pos  Antibody screen neg  Rubella  Immune  Varicella Immune  RPR NR  HBsAg Neg  HIV NR  GC neg  Chlamydia neg  Genetic screening cfDNA negative   1 hour GTT 138  3 hour GTT 82,164,171,152   GBS negative   FHT:  FHR: 120 bpm, variability: moderate,  accelerations:  Present,  decelerations:  Absent Category/reactivity:  Category I UC:   regular, every 2-5 minutes   Cephalic by Leopolds and SVE   No results found.  Assessment:  NAYOMI TABRON is a 36 y.o. [phone] female at 56w5dwith uncontrolled GDM, Suboxone use, hx of Hep C,  Hx of IUGR and smoking in pregnancy, now using patch.   Plan:  1. Admit to Labor & Delivery - consents reviewed and obtained -Labs ordered -Dr Glennon Mac aware of admission  2. Fetal Well being  - Fetal Tracing: Cat 1 - Group B Streptococcus ppx not indicated: GBS negative - Presentation: vtx confirmed by sve   3. Routine OB: - Prenatal labs reviewed, as above - Rh positive - CBC, T&S, RPR on admit - Clear liquid diet , continuous IV fluids  4. Induction of labor  - Contractions monitored with external toco - Pelvis proven to 2580 g  - Plan for induction with oxytocin  - Augmentation with oxytocin and AROM as appropriate  - Plan for  continuous fetal monitoring - Maternal pain control as desired; planning regional anesthesia - Anticipate vaginal delivery  5. Post Partum Planning: - Infant feeding: undecided - Contraception:  undecided - Tdap vaccine: Given prenatally - Flu vaccine: Given prenatally  [place], [place] 97/74/14 23:95 AM  Avelino Leeds, CNM Certified Nurse Midwife [facility]

## 2022-04-30 NOTE — Inpatient Diabetes Management (Signed)
Inpatient Diabetes Program Recommendations  ADA Standards of Care 2023 Diabetes in Pregnancy Target Glucose Ranges:  Fasting: 70 - 95 mg/dL 1 hr postprandial:  838 - 140mg /dL (from first bite of meal) 2 hr postprandial:  100 - 120 mg/dL (from first bit of meal)    Lab Results  Component Value Date   GLUCAP 67 (L) 04/30/2022    Review of Glycemic Control  Diabetes history: GD Outpatient Diabetes medications: None Current orders for Inpatient glycemic control: Pregnant Order Set  Inpatient Diabetes Program Recommendations:   Continue to check CBGs per pregnant order set. Consider use of IV insulin per Endotool if CBGs >120. Will continue to follow during hospitalization.  Thank you, 06/30/2022. Kiyan Burmester, RN, MSN, CDE  Diabetes Coordinator Inpatient Glycemic Control Team Team Pager [phone] (8am-5pm) 04/30/2022 9:21 AM

## 2022-04-30 NOTE — Anesthesia Procedure Notes (Signed)
Epidural Patient location during procedure: OB Start time: 04/30/2022 3:42 PM  Staffing Anesthesiologist: Reed Breech, MD Resident/CRNA: Irving Burton, CRNA Performed: resident/CRNA   Preanesthetic Checklist Completed: patient identified, IV checked, site marked, risks and benefits discussed, surgical consent, monitors and equipment checked, pre-op evaluation and timeout performed  Epidural Patient position: sitting Prep: ChloraPrep Patient monitoring: heart rate, continuous pulse ox and blood pressure Approach: midline Location: L3-L4 Injection technique: LOR saline  Needle:  Needle type: Tuohy  Needle gauge: 17 G Needle length: 9 cm and 9 Needle insertion depth: 5.5 cm Catheter type: closed end flexible Catheter size: 19 Gauge Catheter at skin depth: 10 cm Test dose: negative and 1.5% lidocaine with Epi 1:200 K  Assessment Sensory level: T10 Events: blood not aspirated, injection not painful, no injection resistance, no paresthesia and negative IV test  Additional Notes 1 attempt Pt. Evaluated and documentation done after procedure finished. Patient identified. Risks/Benefits/Options discussed with patient including but not limited to bleeding, infection, nerve damage, paralysis, failed block, incomplete pain control, headache, blood pressure changes, nausea, vomiting, reactions to medication both or allergic, itching and postpartum back pain. Confirmed with bedside nurse the patient's most recent platelet count. Confirmed with patient that they are not currently taking any anticoagulation, have any bleeding history or any family history of bleeding disorders. Patient expressed understanding and wished to proceed. All questions were answered. Sterile technique was used throughout the entire procedure. Please see nursing notes for vital signs. Test dose was given through epidural catheter and negative prior to continuing to dose epidural or start infusion. Warning signs  of high block given to the patient including shortness of breath, tingling/numbness in hands, complete motor block, or any concerning symptoms with instructions to call for help. Patient was given instructions on fall risk and not to get out of bed. All questions and concerns addressed with instructions to call with any issues or inadequate analgesia.    Patient tolerated the insertion well without immediate complications.Reason for block:procedure for pain

## 2022-04-30 NOTE — Progress Notes (Signed)
Pt. Provided with meal tray and juice. Plan to admit to L&D for uncontrolled GDM per CNM order.

## 2022-04-30 NOTE — Progress Notes (Signed)
CBG 159, pt reported she just finished eating about 15 minutes ago after collection. Will reassess in 1-2 hrs per diabetes protocol. Plan for endotool and IV insulin if CBGs remain >120 per diabetes coordinator consult order.

## 2022-05-01 ENCOUNTER — Encounter: Payer: Self-pay | Admitting: Obstetrics and Gynecology

## 2022-05-01 LAB — CBC
HCT: 33.6 % — ABNORMAL LOW (ref 36.0–46.0)
Hemoglobin: 11.5 g/dL — ABNORMAL LOW (ref 12.0–15.0)
MCH: 32.4 pg (ref 26.0–34.0)
MCHC: 34.2 g/dL (ref 30.0–36.0)
MCV: 94.6 fL (ref 80.0–100.0)
Platelets: 117 10*3/uL — ABNORMAL LOW (ref 150–400)
RBC: 3.55 MIL/uL — ABNORMAL LOW (ref 3.87–5.11)
RDW: 13.2 % (ref 11.5–15.5)
WBC: 7.6 10*3/uL (ref 4.0–10.5)
nRBC: 0 % (ref 0.0–0.2)

## 2022-05-01 MED ORDER — BUPRENORPHINE HCL 8 MG SL SUBL
4.0000 mg | SUBLINGUAL_TABLET | Freq: Four times a day (QID) | SUBLINGUAL | Status: DC
  Administered 2022-05-01 – 2022-05-04 (×16): 4 mg via SUBLINGUAL
  Filled 2022-05-01 (×15): qty 1

## 2022-05-01 MED ORDER — BENZOCAINE-MENTHOL 20-0.26 % MT GEL
1.0000 | Freq: Three times a day (TID) | OROMUCOSAL | Status: DC | PRN
  Administered 2022-05-01 – 2022-05-02 (×2): 1 via OROMUCOSAL
  Filled 2022-05-01 (×3): qty 1

## 2022-05-01 MED ORDER — BUPRENORPHINE HCL 8 MG SL SUBL: 4.0000 mg | SUBLINGUAL_TABLET | Freq: Four times a day (QID) | SUBLINGUAL | Status: DC

## 2022-05-01 MED ORDER — BENZOCAINE 10 % MT GEL
Freq: Three times a day (TID) | OROMUCOSAL | Status: DC | PRN
  Filled 2022-05-01: qty 9

## 2022-05-01 NOTE — Progress Notes (Signed)
Post Partum Day 1 Subjective: Doing well, no complaints.  Tolerating regular diet, pain with PO meds, unable to ambulate due to persistent right leg and pelvic numbness.   No CP SOB Fever,Chills, N/V or leg pain; denies nipple or breast pain; no HA change of vision, RUQ/epigastric pain  Objective: BP (!) 109/58 (BP Location: Left Arm)   Pulse 67   Temp 98.5 F (36.9 C) (Oral)   Resp 18   Ht 5' (1.524 m)   Wt 60.8 kg   LMP 08/09/2021 (Within Days)   SpO2 98%   Breastfeeding Unknown   BMI 26.17 kg/m   Vitals:   04/30/22 2021 04/30/22 2024 04/30/22 2029 04/30/22 2043  BP: (!) 137/122 119/71 104/69 112/73   04/30/22 2100 04/30/22 2115 04/30/22 2127 04/30/22 2145  BP: (!) 118/95 122/80 121/70 109/65   04/30/22 2158 05/01/22 0047 05/01/22 0429 05/01/22 0814  BP: 120/70 104/64 107/73 (!) 109/58      Physical Exam:  General: NAD Breasts: soft/nontender CV: RRR Pulm: nl effort Abdomen: soft, NT, BS x 4, suprapubic tenderness.  Perineum: minimal edema, intact Lochia: small Uterine Fundus: fundus firm and below umbilicus DVT Evaluation: no cords, ttp LEs   Recent Labs    04/30/22 0709 05/01/22 0557  HGB 13.2 11.5*  HCT 38.5 33.6*  WBC 6.4 7.6  PLT 120* 117*    Assessment/Plan: 36 y.o. [id number] postpartum day # 1  - Continue routine PP care - Lactation consult prn - acute urinary retention with discomfort, has been able to void 100-1105ml, but persistent retention, with last post-void bladder scan ; discussed with Dr Jean Rosenthal, will place foley cath until tomorrow morning.  - persistent right side pelvic and leg numbness, unable to ambulate or empty bladder adequately.  - CBC reviewed: platelets low but stable.     Disposition: Does not desire Dc home today.     Randa Ngo, CNM 05/01/2022  12:57 PM

## 2022-05-01 NOTE — Progress Notes (Signed)
Pt still having numbness and heaviness in right pelvic/buttocks/lower extremity post epidural. Only able to wiggle right ankle/toes. Anesthesia notified when rounding this morning, will update no change since rounds.

## 2022-05-01 NOTE — Consult Note (Signed)
I spoke with the patient in the NICU after her nurse reported that the patient had concerns about her right leg.   Patient reports numbness in her right thigh that is impeding her ability to ambulate.  Patient had a labor epidural placed at 15:42 yesterday.  She reports that her right leg was much more numb than her left during her labor.  That at the end of her labor she accidentally pulled the epidural from her body which would have been around 19:00 yesterday.  Epidural catheter tip was documented as intact with removal.  That the numbness in the right leg is much improved from yesterday evening and much improved from this morning.  That she now only has numbness in the proximal part of her anterior right thigh and groin. At this time based on her symptoms I suspect that the numbness in her right leg may be do to positioning during labor and or the epidural catheter directing more local anesthetic to her right side. If this is the case then her symptoms should continue to improve as they have today.  This was explained to the patient and her nurse. I instructed the patient and her nurse to alert [place] immediately if her symptoms get worse or they have any other concerns and they voiced understanding. We plan to follow up with the patient tomorrow.

## 2022-05-01 NOTE — Progress Notes (Signed)
Pt still complains of "dead leg' on right side. Is able to wiggle toes on right foot, but does not have sensation to right buttock, thigh, calf or foot and is unable to move leg below the hip. Report from L&D nurse was consistent with these findings post epidural, will continue to monitor and notify anesthesia if numbness does not resolve. Pt is able to stand and pivot to [facility] with 1 person assist for safety, is voiding appropriately.

## 2022-05-01 NOTE — Clinical Social Work Maternal (Signed)
CLINICAL SOCIAL WORK MATERNAL/CHILD NOTE  Patient Details  Name: Laurie Oliver Date MRN: [id number] Date of Birth: 06/26/1986  Date:  05/01/2022  Clinical Social Worker Initiating Note:  Cleotha Whalin Date/Time: Initiated:  05/01/22/      Child's Name:  Laurie Oliver   Biological Parents:  Mother, Father   Need for Interpreter:  None   Reason for Referral:  Other (Comment) (prescribed Subutex)   Address:  [address]  [place] [address]    Phone number:  [phone] (home)     Additional phone number: None  Household Members/Support Persons (HM/SP):   Household Member/Support Person 1, Household Member/Support Person 2, Household Member/Support Person 3   HM/SP Name Relationship DOB or Age  HM/SP -1 Aaron Meister Spouse/FOB unknown  HM/SP -2 Elijah Hensch child 7 years  HM/SP -3 Zara Nicosia child 2 years  HM/SP -4        HM/SP -5        HM/SP -6        HM/SP -7        HM/SP -8          Natural Supports (not living in the home):  Extended Family, Friends, Immediate Family   Professional Supports:     Employment: Full-time   Type of Work: [employer]   Education:      Homebound arranged:    Financial Resources:  Medicaid   Other Resources:  Food Stamps  , WIC   Cultural/Religious Considerations Which May Impact Care:  None  Strengths:  Ability to meet basic needs  , Compliance with medical plan  , Pediatrician chosen, Home prepared for child  , Understanding of illness   Psychotropic Medications:         Pediatrician:    [place]  Pediatrician List:   [place]    [place]    [place] [facility]  [place]    [place]    [place]      Pediatrician Fax Number:    Risk Factors/Current Problems:  Other (Comment) (Subutex)   Cognitive State:  Able to Concentrate  , Alert  , Goal Oriented  , Insightful     Mood/Affect:  Calm  , Happy     CSW Assessment:  CSW received a consult for  Subutex use. Per chart review, there is also mention of alcohol use in prenatal notes.  [name] and Baby's UDS are negative. CDS pending. Baby is also in the Special Care Nursery.   CSW spoke with RN Skyler prior to speaking with [name]. Per RN, [name] is appropriate and no additional concerns.    CSW met with [name]. Explained CSW's role and reason for referral.  [name] reported she is feeling good, just worried about Laurie post delivery. [name] was alert, appropriate, and pleasant during assessment.   Confirmed contact information for [name]. [name] and Baby will be living with FOB and their other 2 children (listed above) at discharge.   [name] denied any alcohol or drug use during pregnancy.  [name] reported she takes Subutex for lower back pain. [name] reported her Prescriber is Sabrina Nelson with [facility] ([phone]). [name] reported she has taken Subutex on and off over several years, but most recently started taking it in November. [name] reported she follows up with [facility] regularly for screenings/visits. CSW notified Tina with [place] DSS of Baby born to Mother taking Subutex per Hospital Policy. Per Tina, no barriers to [name] or Baby being discharged.     [name] was encouraged to reach out with any needs for support while baby is in SCN.  [name] reported she receives WIC and Food Stamps and will inform her Workers of Baby's birth. [name] plans to use [facility] for Baby's Medical Care. [name] reported she has a pack and play, car seat, clothing, diapers, and all other items needed for Baby. [name] reported she has reliable transportation for herself and Baby as she drives. [name] denied resource needs at this time.   [name] reported denied mental health history. [name] reported she has a good support system and is coping well emotionally at this time. [name] denied SI, HI, or DV. [name] denied the need for mental health support resources at this time, reported she is aware of resources if needed.  CSW provided education and information  sheets on PPD and SIDS. [name] verbalized understanding. CSW ecouraged [name] to reach out to her Provider with any questions or needs for support or resources, even after discharge.   [name] denied any needs or questions at this time. CSW encouraged [name] to reach out if any arise prior to discharge.   Please re consult CSW if any additional needs or concerns arise.  CSW Plan/Description:  Sudden Infant Death Syndrome (SIDS) Education, Perinatal Mood and Anxiety Disorder (PMADs) Education, Child Protective Service Report      Aldeen Riga E Garret Teale, LCSW 05/01/2022, 12:12 PM 

## 2022-05-01 NOTE — Progress Notes (Signed)
Anesthesia MD Suzan Slick notified of no change in sensation. Will see patient shortly.

## 2022-05-01 NOTE — Lactation Note (Signed)
This note was copied from a baby's chart. Lactation Consultation Note  Patient Name: Laurie Oliver [id number] Date: 05/01/2022   Age:35 hours  Maternal Data  BAby in SCN, mom has already started pumping after delivery and educated by night nurse.  She is using a 27 mm flange and is sl tender, lanolin given for mom to place in flange for nipple lubrication while pumping.      Feeding    LATCH Score                    Lactation Tools Discussed/Used    Interventions  LC name and no written on white board  Discharge    Consult Status      Dyann Kief 05/01/2022, 6:52 PM

## 2022-05-01 NOTE — Anesthesia Postprocedure Evaluation (Signed)
Anesthesia Post Note  Patient: Laurie Oliver  Procedure(s) Performed: AN AD HOC LABOR EPIDURAL  Patient location during evaluation: Mother Baby Anesthesia Type: Epidural Level of consciousness: awake and alert Pain management: pain level controlled Vital Signs Assessment: post-procedure vital signs reviewed and stable Respiratory status: spontaneous breathing, nonlabored ventilation and respiratory function stable Cardiovascular status: stable Postop Assessment: no headache, no backache and epidural receding Anesthetic complications: no Comments: Pt complaining of persistent numbness in right anterior lateral thigh. Will continue to monitor.    No notable events documented.   Last Vitals:  Vitals:   05/01/22 0429 05/01/22 0814  BP: 107/73 (!) 109/58  Pulse: 71 67  Resp: 20 18  Temp: 36.5 C 36.9 C  SpO2: 100% 98%    Last Pain:  Vitals:   05/01/22 0831  TempSrc:   PainSc: 3                  Kyrillos Adams

## 2022-05-02 DIAGNOSIS — R29898 Other symptoms and signs involving the musculoskeletal system: Secondary | ICD-10-CM

## 2022-05-02 NOTE — Consult Note (Signed)
Neurology Consultation Reason for Consult: Right leg weakness Referring Physician: Lorette Ang, T  CC: Right leg weakness  History is obtained from: Patient  HPI: Laurie Oliver is a 36 y.o. female who underwent scheduled induction due to uncontrolled gestational diabetes on 8/10.  She had relatively unremarkable delivery per notes and had an epidural for anesthesia.  During the delivery she was known, but following discontinuation of the epidural she complained of decreased sensation on the right.  This improved somewhat throughout the day yesterday, but then she thinks it is worsening some again today.  She also notes that she was unable to void, when she tried to only a few drops came out. 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 She has been smoking an average of .5 packs per day. She has never used smokeless tobacco. She reports current alcohol use. She reports that she does not currently use drugs after having used the following drugs: Other-see comments.   Exam: Current vital signs: BP 104/79 (BP Location: Right Arm)   Pulse 92   Temp (!) 97.5 F (36.4 C) (Oral)   Resp 18   Ht 5' (1.524 m)   Wt 60.8 kg   LMP 08/09/2021 (Within Days)   SpO2 100%   Breastfeeding Unknown   BMI 26.17 kg/m  Vital signs in last 24 hours: Temp:  [97.5 F (36.4 C)-98.6 F (37 C)] 97.5 F (36.4 C) (08/12 1147) Pulse Rate:  [76-93] 92 (08/12 1147) Resp:  [18-20] 18 (08/12 1147) BP: (93-108)/(67-79) 104/79 (08/12 1147) SpO2:  [99 %-100  %] 100 % (08/12 1147)   Physical Exam  Constitutional: Appears well-developed and well-nourished.    Neuro: Mental Status: Patient is awake, alert, oriented to person, place, month, year, and situation. Patient is able to give a clear and coherent history. No signs of aphasia or neglect Cranial Nerves: II: Visual Fields are full. Pupils are equal, round, and reactive to light.   III,IV, VI: EOMI without ptosis or diploplia.  V: Facial sensation is symmetric to temperature VII: Facial movement is symmetric.  VIII: hearing is intact to voice X: Uvula elevates symmetrically XI: Shoulder shrug is symmetric. XII: tongue is midline without atrophy or fasciculations.  Motor: Tone is normal. Bulk is normal. 5/5 strength was present bilateral upper extremities and left lower extremity In the right lower extremity she has 3/5 hip flexion, 2/5 knee extension, 2/5 knee flexion, 4/5 plantarflexion, 2/5 dorsiflexion, 2/5 eversion, 4/5 inversion Sensory: Sensation is decreased over the anterior thigh on the right, otherwise relatively intact.  She also describes a band of numbness around her abdomen around the umbilicus. Deep Tendon Reflexes: 2+ and symmetric in the biceps,?  Slightly increased right knee reflex but no spread,?  Due to positioning versus true asymmetry. Plantars: Toes are downgoing bilaterally.  Cerebellar: FNF intact bilaterally   I have reviewed labs in epic and the results pertinent to this consultation are: Mild thrombocytopenia  Impression: 36 year old female with right leg  weakness and difficulty urinating following epidural anesthesia.  It appears to involve multiple nerves and dermatomes, suggesting more central etiology.  I would favor getting imaging of the thoracic and lumbar spine.  Recommendations: 1) MRI T and L-spine 2) neurology will continue to follow   Ritta Slot, MD [facility] [phone]  If 7pm- 7am, please page neurology  on call as listed in AMION.

## 2022-05-02 NOTE — Progress Notes (Signed)
Patient seen and examined. Spoke with her while in the NICU. Patient reports that her progress with her right leg has stopped. Her right leg numbness and ability to ambulate are the same as last night. States she feels numb and weak in the inside of her right leg. She can move her toes and flex her foot but is unable to straighten her right leg.  Discussed with the patient to continue to pay attention to her symptoms and that we would have a neurologist come by to see her due to her symptoms. Discussed with the patient that her symptoms could still be due to her positioning and labor while giving birth. Instructed the patient to tell the nurse if her symptoms got any worse. Spoke with Neurology and stated he would see her today.

## 2022-05-02 NOTE — Progress Notes (Addendum)
Post Partum Day 2 Subjective: Doing well.  Tolerating regular diet, pain with PO meds. She still has difficulty moving right leg, is able to move her right foot. Reports still feels numb in right leg and right hip and abdomen.  No CP SOB Fever,Chills, N/V; denies nipple or breast pain, no HA change of vision, RUQ/epigastric pain  Objective: BP 93/67 (BP Location: Right Arm)   Pulse 76   Temp 97.8 F (36.6 C) (Oral)   Resp 18   Ht 5' (1.524 m)   Wt 60.8 kg   LMP 08/09/2021 (Within Days)   SpO2 100%   Breastfeeding Unknown   BMI 26.17 kg/m    Physical Exam:  General: NAD Breasts: soft/nontender CV: RRR Pulm: nl effort, CTABL Abdomen: soft, NT, BS x 4 Perineum: minimal edema, intact Lochia: moderate Uterine Fundus: fundus firm and 1 fb below umbilicus DVT Evaluation: no cords, ttp LEs  Right leg: wiggled toes of right foot and flexed right foot  Recent Labs    04/30/22 0709 05/01/22 0557  HGB 13.2 11.5*  HCT 38.5 33.6*  WBC 6.4 7.6  PLT 120* 117*    Assessment/Plan: 36 y.o. [id number] postpartum day # 2  - Continue routine PP care - Lactation consult prn - Persistent feelings of right leg numbness. She is not ambulating, states she is unable to bear weight in her right leg. Anesthesia saw her today and is consulting neurology. - Urinary retention, foley catheter in place. Discussed removing the catheter today but she is anxious and states she is unable to feel her lower abdomen or pelvis so is worried about urinary retention again. Will leave urinary catheter in place d/t numbness and urinary retention. Continue Foley catheter care.  - Dr. Jean Rosenthal updated on plan of care and assessment.  Disposition: Does not desire Dc home today.   Janyce Llanos, CNM 05/02/2022 11:13 AM

## 2022-05-03 ENCOUNTER — Inpatient Hospital Stay: Payer: Medicaid Other

## 2022-05-03 DIAGNOSIS — R29898 Other symptoms and signs involving the musculoskeletal system: Secondary | ICD-10-CM | POA: Diagnosis not present

## 2022-05-03 MED ORDER — GABAPENTIN 300 MG PO CAPS
300.0000 mg | ORAL_CAPSULE | Freq: Three times a day (TID) | ORAL | Status: DC
  Administered 2022-05-03 – 2022-05-04 (×5): 300 mg via ORAL
  Filled 2022-05-03 (×5): qty 1

## 2022-05-03 NOTE — Evaluation (Signed)
Physical Therapy Evaluation Patient Details Name: Laurie Oliver MRN: [id number] DOB: Sep 02, 1986 Today's Date: 05/03/2022  History of Present Illness  admitted for acute hospitalization s/p spontaneous vaginal delivery (04/30/22) with residual R LE weakness/paresthesia.  T-spine, L-spine and intra-cranial MRI negative for acute pathology; pelvic MRI with possible mass effect on bilat femoral and obturator nerves (due to enlarged uterus), but no significant lesion, injury.  Clinical Impression  Patient resting in bed, modified long-sitting upon arrival to room.  Alert and oriented to all information; follows commands and agreeable for participation with session.  Endorses persistent pain to R groin, low back and R ankle/foot, FACES 6/10; meds administered prior to session.   Scattered and inconsistent strength and sensory deficits to R LE noted on exam (see details below), with moderate inconsistencies noted with isolated assessment versus functional activities during session.   Currently, able to complete bed mobility with mod indep (self-assisting R LE with bilat UEs); sit/stand, basic transfers and gait (25') with RW, cga/min assist.  Demonstrates 3-point, step-to gait pattern; min cuing for R TKE in loading phases with initial steps (improving with repetition).  Tends to maintain R LE in rigid extension with advancement efforts; ability improves with cuing to relax LE and allow/promote some R knee flexion in swing. No noted R LE foot drop with R LE advancement. Would benefit from skilled PT to address above deficits and promote optimal return to PLOF.; Recommend transition to HHPT upon discharge from acute hospitalization.     Recommendations for follow up therapy are one component of a multi-disciplinary discharge planning process, led by the attending physician.  Recommendations may be updated based on patient status, additional functional criteria and insurance authorization.  Follow Up  Recommendations Home health PT      Assistance Recommended at Discharge PRN  Patient can return home with the following  A little help with walking and/or transfers;A little help with bathing/dressing/bathroom;Help with stairs or ramp for entrance;Assist for transportation    Equipment Recommendations Rolling walker (2 wheels)  Recommendations for Other Services       Functional Status Assessment Patient has had a recent decline in their functional status and demonstrates the ability to make significant improvements in function in a reasonable and predictable amount of time.     Precautions / Restrictions Precautions Precautions: Fall Restrictions Weight Bearing Restrictions: No      Mobility  Bed Mobility Overal bed mobility: Modified Independent             General bed mobility comments: fluidly moves from supine/long sitting, easily touches toes in long-sitting with good lumbar/pelvic/hip flexibility.  Self-mobilizes R LE in/out of bed with bilat UEs.    Transfers Overall transfer level: Needs assistance Equipment used: Rolling walker (2 wheels) Transfers: Sit to/from Stand Sit to Stand: Min guard           General transfer comment: Heavy use of bilat UEs to complete; limited WBing R LE with initial efforts, but improves with repetition and education    Ambulation/Gait Ambulation/Gait assistance: Min guard Gait Distance (Feet):  (4 steps forward/backward, 25' x1) Assistive device: Rolling walker (2 wheels)         General Gait Details: 3-point, step-to gait pattern; min cuing for R TKE in loading phases with initial steps (improving with repetition).  Tends to maintain R LE in rigid extension with advancement efforts; ability improves with cuing to relax LE and allow/promote some R knee flexion in swing. No noted R LE foot drop with  R LE advancement.  Stairs            Wheelchair Mobility    Modified Rankin (Stroke Patients Only)        Balance Overall balance assessment: Needs assistance Sitting-balance support: No upper extremity supported, Feet supported Sitting balance-Leahy Scale: Normal     Standing balance support: Bilateral upper extremity supported Standing balance-Leahy Scale: Fair                               Pertinent Vitals/Pain Pain Assessment Pain Assessment: Faces Faces Pain Scale: Hurts even more Pain Location: low back, R groin area Pain Descriptors / Indicators: Aching, Grimacing, Guarding Pain Intervention(s): Limited activity within patient's tolerance, Monitored during session, Repositioned    Home Living Family/patient expects to be discharged to:: Private residence Living Arrangements: Spouse/significant other Available Help at Discharge: Family;Available PRN/intermittently Type of Home: Mobile home Home Access: Stairs to enter Entrance Stairs-Rails: None Entrance Stairs-Number of Steps: 2-3   Home Layout: One level Home Equipment: None      Prior Function Prior Level of Function : Independent/Modified Independent             Mobility Comments: Indep with ADLs, household and community mobilization; 3-other children at home (oldest 76, youngest almost 3; now baby)       Hand Dominance        Extremity/Trunk Assessment   Upper Extremity Assessment Upper Extremity Assessment: Overall WFL for tasks assessed    Lower Extremity Assessment Lower Extremity Assessment: RLE deficits/detail RLE Deficits / Details: R hip flexion, extension, abduct/adduct 3-/5, R knee flexion 3-/5, R knee extension 3-/5, R ankle DF 2-/5, PF 1/5, inversion 3+/5, eversion 2-/5.  Endorses generalized paresthesia R groin and low back (L1 distribution, but normal lateral aspect of thigh) and medial aspect of R foot (L5 distribution, but normal lateral aspect of lower leg).  Isolated muscle control and performance inconsistent with functional performance at times.       Communication    Communication: No difficulties  Cognition Arousal/Alertness: Awake/alert Behavior During Therapy: WFL for tasks assessed/performed Overall Cognitive Status: Within Functional Limits for tasks assessed                                          General Comments      Exercises Other Exercises Other Exercises: Sit/stand from edge of bed, min assist-emphasis on neutral positioning, facilitated (and progressive increase) WBing R LE Other Exercises: Alternate LE forward/backward stepping with RW, cga/min assist-improved R TKE in loading with repetition (no overt buckling).  Initially with difficulty advancing, but improved with education in mechanics. Other Exercises: Educated in R ankle DF self-stretching to prevent contracture; returned demonstration of technique with sheet   Assessment/Plan    PT Assessment Patient needs continued PT services  PT Problem List Decreased strength;Decreased activity tolerance;Decreased balance;Decreased range of motion;Decreased coordination;Impaired sensation;Pain;Decreased knowledge of precautions;Decreased safety awareness;Decreased mobility       PT Treatment Interventions DME instruction;Gait training;Stair training;Functional mobility training;Therapeutic activities;Therapeutic exercise;Balance training;Patient/family education    PT Goals (Current goals can be found in the Care Plan section)  Acute Rehab PT Goals Patient Stated Goal: to go home Time For Goal Achievement: 05/17/22 Potential to Achieve Goals: Good    Frequency Min 2X/week     Co-evaluation  AM-PAC PT "6 Clicks" Mobility  Outcome Measure Help needed turning from your back to your side while in a flat bed without using bedrails?: None Help needed moving from lying on your back to sitting on the side of a flat bed without using bedrails?: None Help needed moving to and from a bed to a chair (including a wheelchair)?: A Little Help needed  standing up from a chair using your arms (e.g., wheelchair or bedside chair)?: A Little Help needed to walk in hospital room?: A Little Help needed climbing 3-5 steps with a railing? : A Little 6 Click Score: 20    End of Session Equipment Utilized During Treatment: Gait belt Activity Tolerance: Patient tolerated treatment well Patient left: in bed;with call bell/phone within reach;with bed alarm set Nurse Communication: Mobility status PT Visit Diagnosis: Muscle weakness (generalized) (M62.81);Difficulty in walking, not elsewhere classified (R26.2)    Time: 1511-1540 PT Time Calculation (min) (ACUTE ONLY): 29 min   Charges:   PT Evaluation $PT Eval Moderate Complexity: 1 Mod PT Treatments $Therapeutic Activity: 8-22 mins        Naphtali Zywicki H. Manson Passey, PT, DPT, NCS 05/03/22, 4:14 PM [phone]

## 2022-05-03 NOTE — Discharge Instructions (Signed)

## 2022-05-03 NOTE — Progress Notes (Signed)
Post Partum Day 3 Subjective: Doing well.  Tolerating regular diet, pain with PO meds, voiding without difficulty. Able to stand on own.  No CP SOB Fever,Chills, N/V or leg pain; denies nipple or breast pain, no HA change of vision, RUQ/epigastric pain  Objective: BP 118/85 (BP Location: Right Arm)   Pulse 79   Temp 98.2 F (36.8 C) (Oral)   Resp 18   Ht 5' (1.524 m)   Wt 60.8 kg   LMP 08/09/2021 (Within Days)   SpO2 100%   Breastfeeding Unknown   BMI 26.17 kg/m    Physical Exam:  General: NAD Breasts: soft/nontender CV: RRR Pulm: nl effort, CTABL Abdomen: soft, NT, BS x 4 Perineum: minimal edema, intact Lochia: moderate Uterine Fundus: fundus firm and 1 fb below umbilicus DVT Evaluation: no cords, ttp LEs  Right leg: wiggled toes of right foot and flexed right foot  Recent Labs    05/01/22 0557  HGB 11.5*  HCT 33.6*  WBC 7.6  PLT 117*    Assessment/Plan: 36 y.o. [id number] postpartum day # 3  - Continue routine PP care, baby in NICU - Lactation consult prn - Discussed contraceptive options including implant, IUDs hormonal and non-hormonal, injection, pills/ring/patch, condoms, and NFP.  - Right leg numbness and weakness: anesthesia cleared her, neurology saw her and ordered 4 MRIs which are normal. Consulted PT and OT. PT saw her this afternoon and the patient was able to walk. They will see her again tomorrow and organize home health. - Pain management: she is taking her prenatal dose of Suboxone. Discussed pain management with Dr. Jean Rosenthal and Dr. Amada Jupiter and discontinuing oxycodone and starting Gabapentin. - Dr. Jean Rosenthal updated on patient's status and plan of care. - Immunization status: all Imms up to date  Disposition: Does not desire Dc home today.   Janyce Llanos, CNM 05/03/2022 3:47 PM

## 2022-05-03 NOTE — Progress Notes (Signed)
Subjective: No significant changes, she does complain of significant pain in the right leg as well.  Exam: Vitals:   05/02/22 2252 05/03/22 0726  BP: 114/80 118/85  Pulse: 67 79  Resp: 20 18  Temp:  98.2 F (36.8 C)  SpO2: 99% 100%   Gen: In bed, NAD Resp: Legs are symmetrically perfused, with good pulses in both the dorsalis pedis and posterior tibial arteries.  Neuro: MS: Awake, alert, interactive and appropriate CN: Visual fields full, EOMI Motor: She has significant right leg weakness in all muscle groups of the right leg, 3/5 hip flexion, 4/5 adduction 3/5 knee extension, 2/5 knee flexion, 4/5 plantarflexion, 2/5 dorsiflexion, 2/5 eversion, 4/5 inversion.  There is some inconsistency when tested in different ways. Sensory: She has diminished sensation to light touch over the anterior aspect of her right thigh as well as around the great toe. DTR: 2+ and symmetric at the biceps, patella, ankles  Impression: 36 year old female with right leg weakness after vaginal delivery.  With negative T and L-spine MRI, this is an unusual presentation.  She has involvement of multiple nerve roots as well as multiple peripheral nerves, but with pain being such a prominent component this would be unusual for central etiology.  That being said, in the peripartum period she is at risk for stroke and therefore I think an MRI of the brain would be prudent.  Lumbosacral plexopathy could also be considered and I think an MRI looking for any type of lesion that could be affecting the lumbosacral plexus on the right would also be prudent.  Lumbosacral plexopathy after vaginal delivery has been described but appears to be extremely rare, with relatively good long-term outcome in the cases I was able to find.  Recommendations: 1) MRI brain to assess for stroke 2) MRI pelvis to assess for any type of pathology affecting the lumbosacral plexus on the right 3) if the above are negative, care will be supportive  with physical therapy 4) could consider gabapentin 300 mg 3 times daily for pain control 5) if imaging is negative, would consider outpatient EMG after 4 to 6 weeks.  Ritta Slot, MD [facility] [phone]  If 7pm- 7am, please page neurology on call as listed in AMION.

## 2022-05-03 NOTE — Progress Notes (Signed)
PT Cancellation Note  Patient Details Name: ZADAYA CUADRA MRN: [id number] DOB: 05/12/1986   Cancelled Treatment:    Reason Eval/Treat Not Completed: Medical issues which prohibited therapy (Consult received, chart reviewed.  Per chart, patient still undergoing work up/imaging for etiology of R LE weakness/sensory deficit (pelvic MRI pending).  Will hold PT evaluation until work up complete; will continue to follow and initiate next date as medically appropriate.)  Tanaysia Bhardwaj H. Manson Passey, PT, DPT, NCS 05/03/22, 1:47 PM [phone]

## 2022-05-03 NOTE — TOC Progression Note (Signed)
Transition of Care [facility]) - Progression Note    Patient Details  Name: Laurie Oliver MRN: [id number] Date of Birth: 1986-08-15  Transition of [facility]) CM/SW Contact  Liliana Cline, LCSW Phone Number: 05/03/2022, 6:07 PM  Clinical Narrative:    PT rec HHPT and RW. Spoke to patient who is agreeable. Explained at times it is hard to find [facility] coverage for Medicaid. Patient prefers HH, but says she would consider OPPT if HH cannot be established. RW ordered through [employer] for delivery to bedside tomorrow - asked MD for orders.  Reached out to the following [facility] agencies: [facility]- unable to take [facility] [employer]- unable to take [facility] [employer]- waiting on response [facility]- waiting on response [facility]- waiting on response.         Expected Discharge Plan and Services                                                 Social Determinants of Health (SDOH) Interventions    Readmission Risk Interventions     No data to display

## 2022-05-04 MED ORDER — DIPHENHYDRAMINE HCL 25 MG PO CAPS
25.0000 mg | ORAL_CAPSULE | Freq: Four times a day (QID) | ORAL | 0 refills | Status: AC | PRN
Start: 2022-05-04 — End: ?

## 2022-05-04 MED ORDER — SENNOSIDES-DOCUSATE SODIUM 8.6-50 MG PO TABS: 2.0000 | ORAL_TABLET | ORAL | 0 refills | Status: AC

## 2022-05-04 MED ORDER — ACETAMINOPHEN 500 MG PO TABS: 1000.0000 mg | ORAL_TABLET | Freq: Four times a day (QID) | ORAL | 0 refills | Status: AC | PRN

## 2022-05-04 MED ORDER — ONDANSETRON HCL 4 MG PO TABS
4.0000 mg | ORAL_TABLET | Freq: Three times a day (TID) | ORAL | 0 refills | Status: AC | PRN
Start: 2022-05-04 — End: ?

## 2022-05-04 MED ORDER — WITCH HAZEL-GLYCERIN EX PADS: 1.0000 | MEDICATED_PAD | CUTANEOUS | 12 refills | Status: AC | PRN

## 2022-05-04 MED ORDER — GABAPENTIN 300 MG PO CAPS: 300.0000 mg | ORAL_CAPSULE | Freq: Three times a day (TID) | ORAL | 0 refills | Status: AC

## 2022-05-04 MED ORDER — NICOTINE 21 MG/24HR TD PT24
21.0000 mg | MEDICATED_PATCH | Freq: Every day | TRANSDERMAL | 0 refills | Status: AC
Start: 2022-05-05 — End: 2022-05-19

## 2022-05-04 MED ORDER — BENZOCAINE-MENTHOL 20-0.5 % EX AERO: 1.0000 | INHALATION_SPRAY | CUTANEOUS | Status: AC | PRN

## 2022-05-04 NOTE — Progress Notes (Signed)
     [facility] REFERRAL        Occupational Therapy * Physical Therapy * Speech Therapy                           DATE 05/04/22  PATIENT NAME   Laurie Oliver  PATIENT MRN  [id number]       DIAGNOSIS/DIAGNOSIS CODE Z34.90  DATE OF DISCHARGE: 05/04/22       PRIMARY CARE PHYSICIAN   [facility] Dan Humphreys   PCP PHONE/FAX  [phone]     Dear Provider (Name: [facility] outpatient __  Fax: [phone]   I certify that I have examined this patient and that occupational/physical/speech therapy is necessary on an outpatient basis.    The patient has expressed interest in completing their recommended course of therapy at your  location.  Once a formal order from the patient's primary care physician has been obtained, please contact him/her to schedule an appointment for evaluation at your earliest convenience.   Arly.Keller ]  Physical Therapy Evaluate and Treat      The patient's primary care physician (listed above) must furnish and be responsible for a formal order such that the recommended services may be furnished while under the primary physician's care, and that the plan of care will be established and reviewed every 30 days (or more often if condition necessitates).  [place], [place] [phone]

## 2022-05-04 NOTE — Progress Notes (Signed)
All discharge instructions given and reviewed with pt; pt knows she can stay and do some infant feedings in SCN (baby in SCN); pt knows she can't sleep in the SCN and pt agreeable to this; pt knows she will be discharged at 2359

## 2022-05-04 NOTE — Lactation Note (Signed)
This note was copied from a baby's chart. Lactation Consultation Note  Patient Name: Laurie Oliver Date: 05/04/2022 Reason for consult: Follow-up assessment;Early term 37-38.6wks Age:35 days  Maternal Data Has patient been taught Hand Expression?: Yes Does the patient have breastfeeding experience prior to this delivery?: Yes How long did the patient breastfeed?: 54mo/57mo  3rd baby for this mom. Mature milk has transitioned in, heavy/swollen breasts with hard lumps at times. Pumping more than 2mL.  Mom on subutex; providing EBM to baby in SCN.  Feeding Mother's Current Feeding Choice: Breast Milk and Formula  Baby receiving EBM from mom. This feeding has baby going to breast with mom.  LATCH Score Latch: Grasps breast easily, tongue down, lips flanged, rhythmical sucking.  Audible Swallowing: A few with stimulation  Type of Nipple: Everted at rest and after stimulation  Comfort (Breast/Nipple): Soft / non-tender  Hold (Positioning): Assistance needed to correctly position infant at breast and maintain latch.  LATCH Score: 8  Baby initially was having difficulty sustaining latch due the fullness of mom's breast, and lack of support with positioning. Pillow support added, LC assisted with head/neck support of baby. Nipple shield applied to help sustain latch and accept breast. Baby took several minutes before coming active; occasional swallows noted but related to mom expressing milk into the shield (little effort was needed).  Once baby became more active he developed a strong rhythmic sucking pattern and sustained this pattern for several minutes with visible swallows. Baby became tired at around of effort.  Lactation Tools Discussed/Used Tools: Pump;Nipple Shields Nipple shield size: 20 Breast pump type: Double-Electric Breast Pump Reason for Pumping: baby in SCN Pumping frequency: q 3 hrs Pumped volume: 90 mL  Mom needs pump for discharge. Working  alongside [facility] to obtain a pump.  Interventions Interventions: Breast feeding basics reviewed;Assisted with latch;Hand express;Adjust position;Support pillows;Education (nipple shield)  Discharge Discharge Education: Engorgement and breast care WIC Program: Yes  Consult Status Consult Status: Follow-up    Danford Bad 05/04/2022, 11:40 AM

## 2022-05-04 NOTE — Progress Notes (Signed)
Spoke with Dr. Dalbert Garnet over the phone. Pt stating that she is not supposed to be discharging today, says she "was told by 2 doctors that she could stay another night". Verified d/c order and instructions for follow up with Dr. Dalbert Garnet over the phone at bedside with patient for clarification. Pt agrees, no further questions at this time. [facility] Claris Gladden called to room as witness to discuss with family that patient will have transportation and will not stay overnight at the hospital, but is able to visit in the SCN throughout the night for breastfeeding and pumping. Pt expressed worry that infant may not have enough milk and does not wish for him to have formula. Pt encouraged to relay her concerns to [facility] nurse and provider, infant currently has more than adequate supply in SCN refrigerator.

## 2022-05-04 NOTE — Progress Notes (Signed)
Post Partum Day 3 Subjective: Doing well.  Tolerating regular diet, pain with PO meds, voiding without difficulty. Able to stand on own.  Is pumping and has seen lac. Is working with PT  No CP SOB Fever,Chills, N/V or leg pain; denies nipple or breast pain, no HA change of vision, RUQ/epigastric pain  Objective: BP 108/81 (BP Location: Right Arm)   Pulse 78   Temp 97.8 F (36.6 C) (Axillary)   Resp 18   Ht 5' (1.524 m)   Wt 60.8 kg   LMP 08/09/2021 (Within Days)   SpO2 100%   Breastfeeding Unknown   BMI 26.17 kg/m    Physical Exam:  General: NAD Breasts: soft/nontender    Assessment/Plan: 36 y.o. [id number] postpartum day # 4  - Continue routine PP care, baby in NICU - Lactation consult prn - Discussed contraceptive options including implant, IUDs hormonal and non-hormonal, injection, pills/ring/patch, condoms, and NFP.  Plan for Depo - Right leg numbness and weakness: anesthesia cleared her, neurology saw her and ordered 4 MRIs which are normal. Consulted PT and OT. PT saw her this afternoon and the patient was able to walk. Transfers ok, walkign with walker delivered today - Pain management: she is taking her prenatal dose of Suboxone (4mg  4x daily). Oxy d/c this morning. Gabapentin TID - needs breast pump - d/c home with home health vs outpatient PT    Disposition:Will try for rooming in. Baby now on room air, able to breastfeed.   , MD 05/04/2022 12:18 PM

## 2022-05-04 NOTE — Progress Notes (Signed)
OT Screen Note  Patient Details Name: Laurie Oliver MRN: [id number] DOB: 16-Nov-1985   Cancelled Treatment:    Reason Eval/Treat Not Completed: OT screened, no needs identified, will sign off. Order received, chart reviewed. Pt endorses no OT needs, indep with ADL at this time. PT in agreement. Will sign off. Please re-consult if additional needs arise.   Arman Filter., MPH, MS, OTR/L ascom [phone] 05/04/22, 10:28 AM

## 2022-05-04 NOTE — Progress Notes (Signed)
Physical Therapy Treatment Patient Details Name: Laurie Oliver MRN: [id number] DOB: August 15, 1986 Today's Date: 05/04/2022   History of Present Illness admitted for acute hospitalization s/p spontaneous vaginal delivery (04/30/22) with residual R LE weakness/paresthesia.  T-spine, L-spine and intra-cranial MRI negative for acute pathology; pelvic MRI with possible mass effect on bilat femoral and obturator nerves (due to enlarged uterus), but no significant lesion, injury.    PT Comments    Patient with pleasant, energetic affect; reporting improvement in symptoms and functional ability since previous date. Able to complete bed mobility with mod indep, actively elevating R LE in/out of bed without physical assist this date.  Completes sit/stand, basic transfers, gait and stairs (3 steps) with RW, cga/close sup.  Able to safely complete mobility necessary to enter/exit home at discharge.  R LE reassessment: hip, knee and ankle at least 3+ to 4-/5 this date; full R ankle DF noted.  Continues to endorse generalized paresthesia, but decreased since previous date (more "throbbing" today).  Reviewed safety needs for discharge and recommendations for continued use of RW; patient voiced understanding of all information.  Does also endorse indep bathing, dressing this AM prior to PT session; no OT needs noted.  Patient in agreement.  Information communicated to OT and order updated to reflect.    Recommendations for follow up therapy are one component of a multi-disciplinary discharge planning process, led by the attending physician.  Recommendations may be updated based on patient status, additional functional criteria and insurance authorization.  Follow Up Recommendations  Home health PT     Assistance Recommended at Discharge PRN  Patient can return home with the following A little help with walking and/or transfers;A little help with bathing/dressing/bathroom;Help with stairs or ramp for  entrance;Assist for transportation   Equipment Recommendations  Rolling walker (2 wheels)    Recommendations for Other Services       Precautions / Restrictions Precautions Precautions: Fall Restrictions Weight Bearing Restrictions: No     Mobility  Bed Mobility Overal bed mobility: Modified Independent             General bed mobility comments: able to indep elevate R LE into bed (against gravity) this date without physical assist    Transfers Overall transfer level: Needs assistance Equipment used: Rolling walker (2 wheels) Transfers: Sit to/from Stand Sit to Stand: Supervision           General transfer comment: continues with moderate use of bilat UEs on RW for transitional movements, but improved active use and integration of R LE    Ambulation/Gait Ambulation/Gait assistance: Supervision Gait Distance (Feet):  (30' x2) Assistive device: Rolling walker (2 wheels)         General Gait Details: 3-point, step to gait pattern; improved WBing/stance to R LE, improved R quad activation and knee control.  Appropriately DF R ankle throughout gait cycle; good foot control. No buckling or LOB; however, do recommend continued use of RW at this time.   Stairs Stairs: Yes Stairs assistance: Supervision, Min guard Stair Management: Backwards, With walker Number of Stairs: 3 General stair comments: step to gait pattern; min assist for RW position throughout task. Min cuing for R TKE in modified SLS; good R knee control.  Spontaneously descends final step, leading with L LE (demonstrating good eccentric control in R LE with full body weight)   Wheelchair Mobility    Modified Rankin (Stroke Patients Only)       Balance Overall balance assessment: Needs assistance Sitting-balance support: No  upper extremity supported, Feet supported Sitting balance-Leahy Scale: Good     Standing balance support: Bilateral upper extremity supported Standing balance-Leahy  Scale: Fair                              Cognition Arousal/Alertness: Awake/alert Behavior During Therapy: WFL for tasks assessed/performed Overall Cognitive Status: Within Functional Limits for tasks assessed                                 General Comments: slightly impulsive and hyperactive, generally pleased with progress and eager for continued progression        Exercises Other Exercises Other Exercises: Reinforced use of RW with all mobility tasks, allow family to carry baby until R LE fully recovers for optimal safety; patient voiced understanding.    General Comments        Pertinent Vitals/Pain Pain Assessment Pain Assessment: Faces Faces Pain Scale: Hurts a little bit Pain Location: low back, R groin area Pain Descriptors / Indicators: Aching, Grimacing, Guarding Pain Intervention(s): Limited activity within patient's tolerance, Monitored during session, Premedicated before session, Repositioned    Home Living                          Prior Function            PT Goals (current goals can now be found in the care plan section) Acute Rehab PT Goals Patient Stated Goal: to go home Time For Goal Achievement: 05/17/22 Potential to Achieve Goals: Good Progress towards PT goals: Progressing toward goals    Frequency    Min 2X/week      PT Plan Current plan remains appropriate    Co-evaluation              AM-PAC PT "6 Clicks" Mobility   Outcome Measure  Help needed turning from your back to your side while in a flat bed without using bedrails?: None Help needed moving from lying on your back to sitting on the side of a flat bed without using bedrails?: None Help needed moving to and from a bed to a chair (including a wheelchair)?: None Help needed standing up from a chair using your arms (e.g., wheelchair or bedside chair)?: None Help needed to walk in hospital room?: None Help needed climbing 3-5 steps with  a railing? : A Little 6 Click Score: 23    End of Session Equipment Utilized During Treatment: Gait belt Activity Tolerance: Patient tolerated treatment well Patient left: in bed;with call bell/phone within reach Nurse Communication: Mobility status PT Visit Diagnosis: Muscle weakness (generalized) (M62.81);Difficulty in walking, not elsewhere classified (R26.2)     Time: 4696-2952 PT Time Calculation (min) (ACUTE ONLY): 19 min  Charges:  $Gait Training: 8-22 mins                     Cyara Devoto H. Manson Passey, PT, DPT, NCS 05/04/22, 10:34 AM [phone]

## 2022-05-05 NOTE — Progress Notes (Signed)
Pt in SCN feeding/holding baby; pt aware that she is "discharged" at this time

## 2022-05-07 MED FILL — Benzocaine Dental Paste 20%: OROMUCOSAL | Qty: 7 | Status: AC

## 2022-05-31 IMAGING — US US OB COMP LESS 14 WK
2 series · 14 of 28 positions shown · non-contrast
Comparison: None.

CLINICAL DATA: Unsure of LMP.

EXAM:
OBSTETRIC <14 WK ULTRASOUND
TECHNIQUE: Transabdominal ultrasound was performed for evaluation of the
gestation as well as the maternal uterus and adnexal regions.

[Series 1: us ob comp less 14 wk · 0.11mm/px · 13 of 27 slices shown (1 of 2)]
[im 2/27]
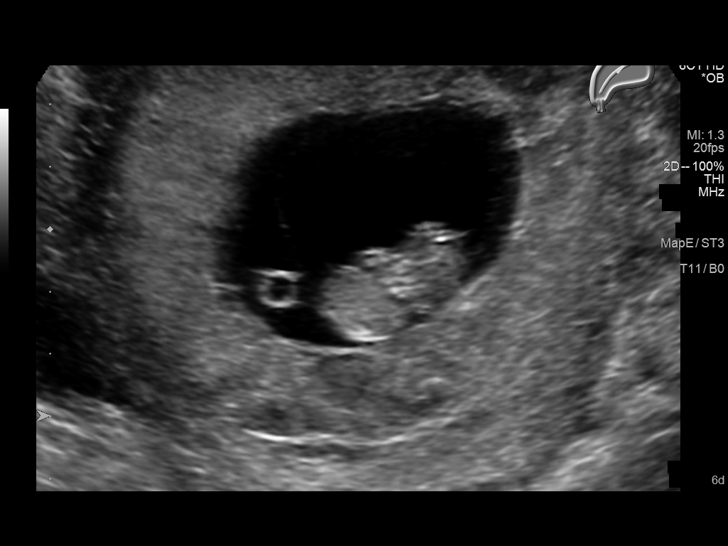
[im 4/27]
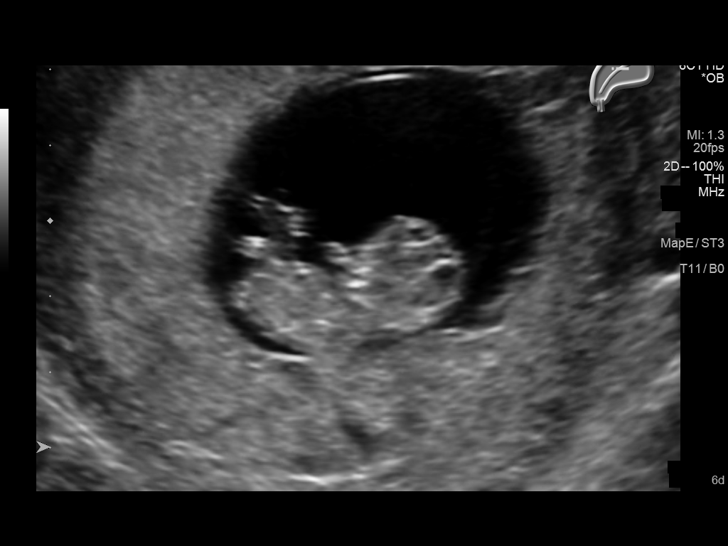
[im 6/27]
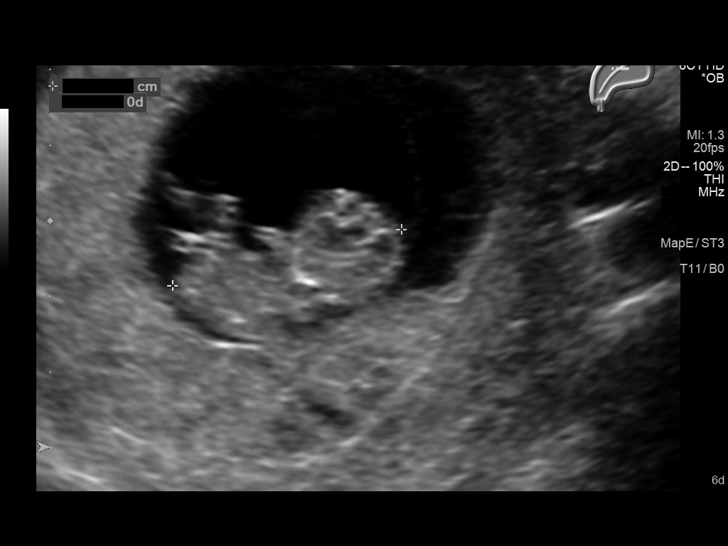
[im 8/27]
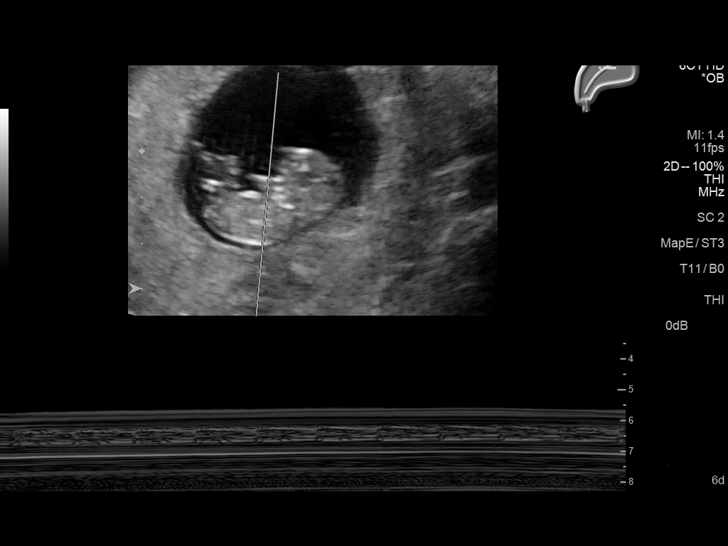
[im 10/27]
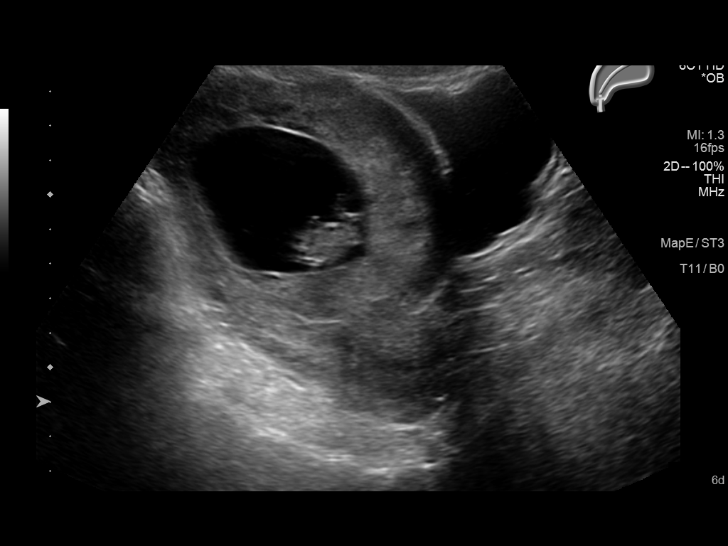
[im 12/27]
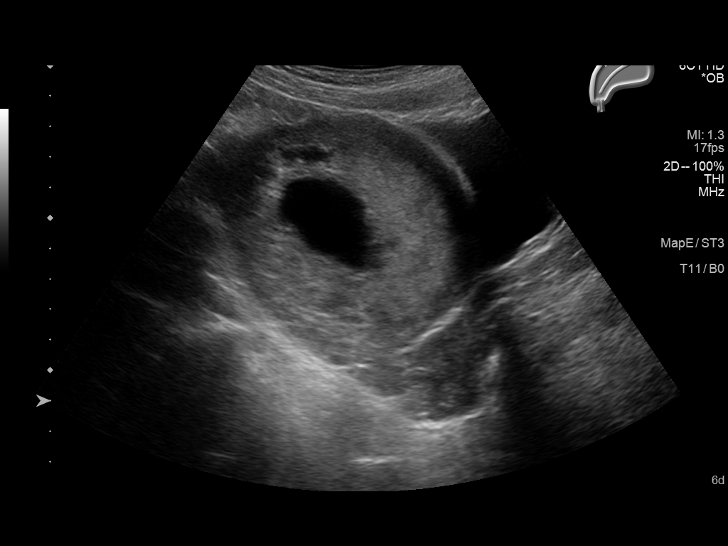
[im 14/27]
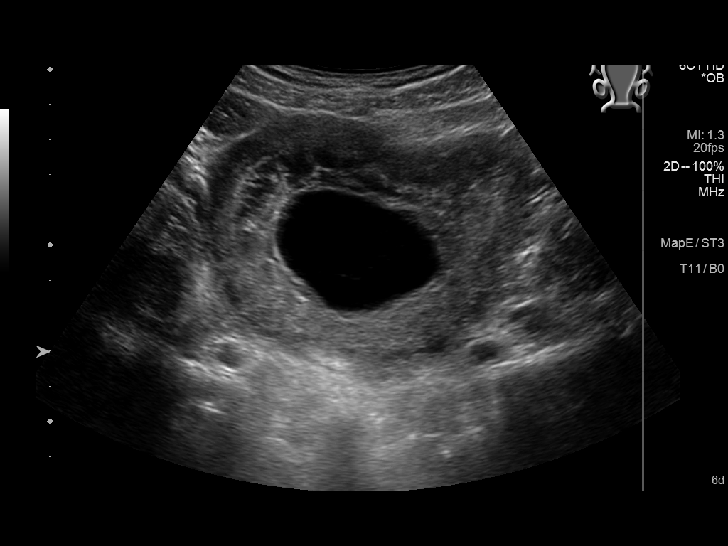
[im 16/27]
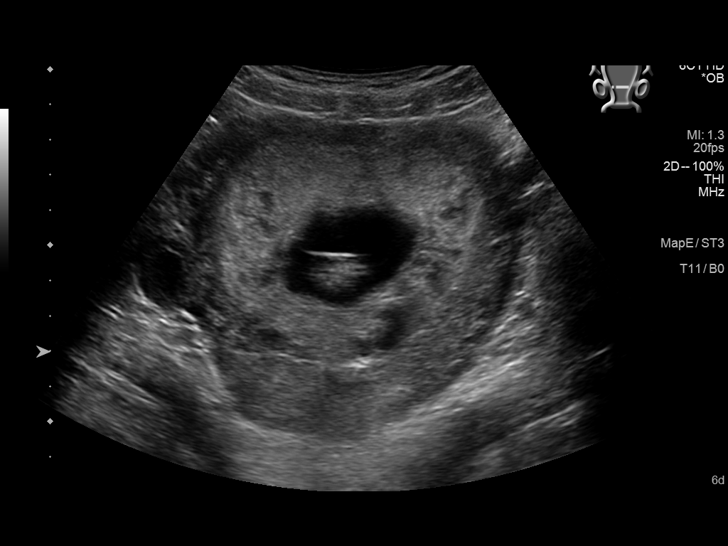
[im 18/27]
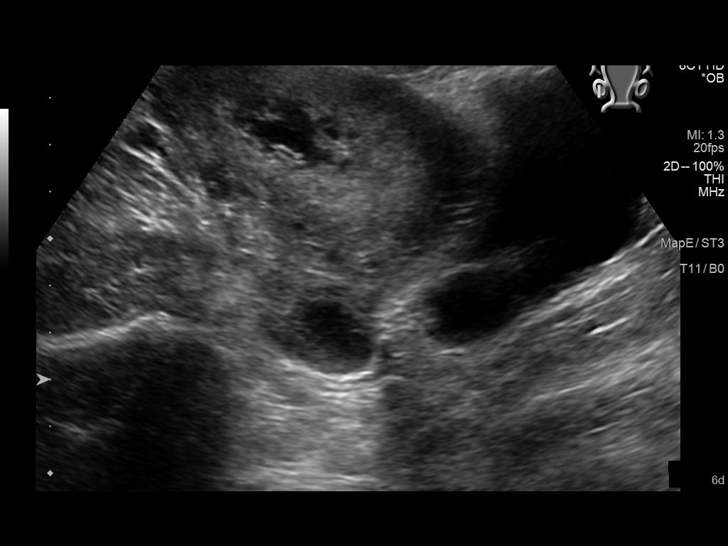
[im 20/27]
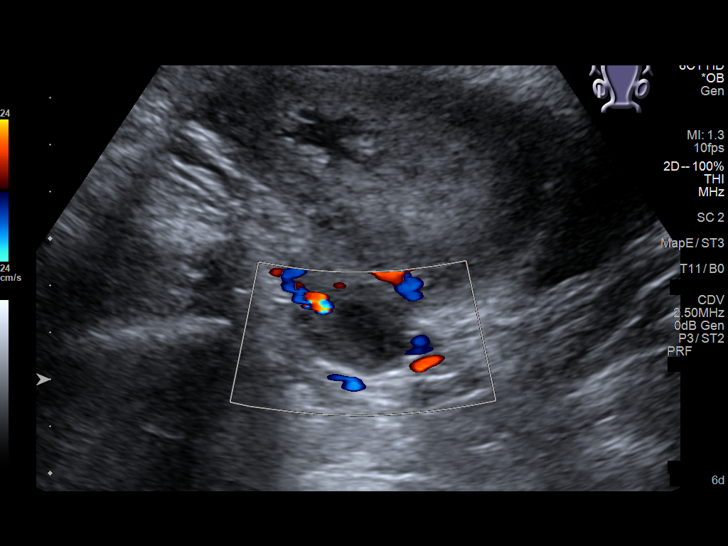
[im 22/27]
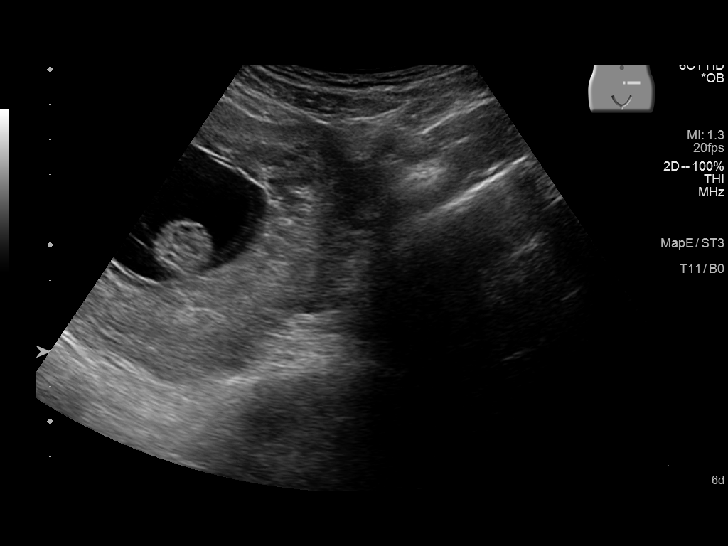
[im 24/27]
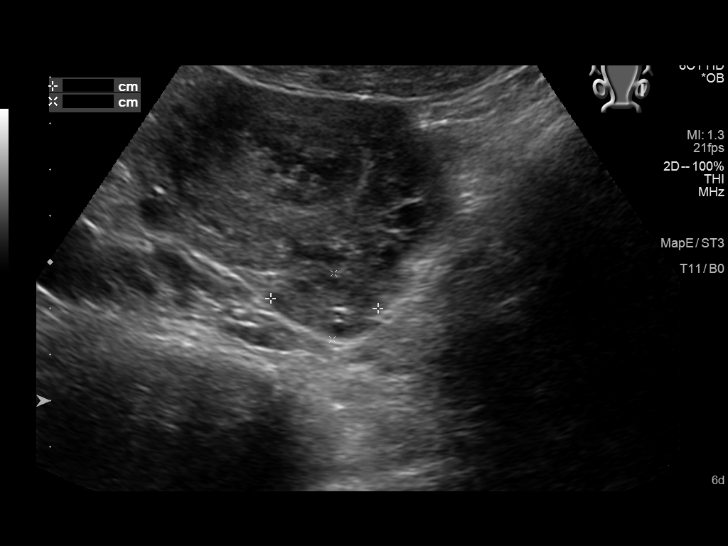
[im 26/27]
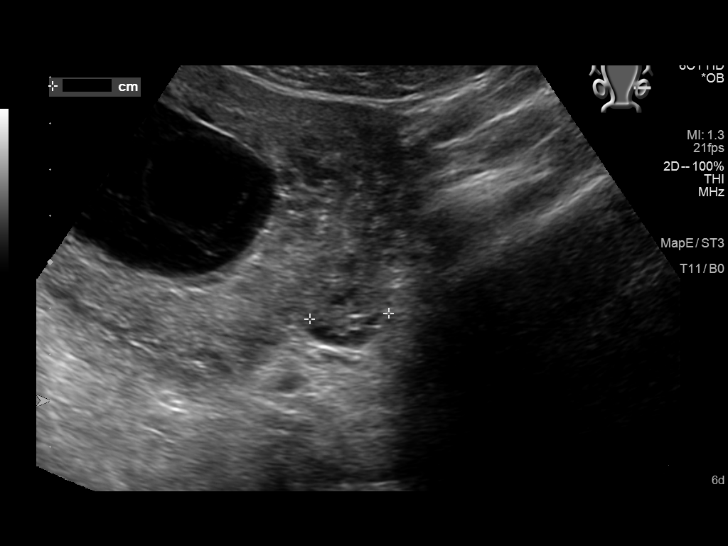

[Series 1: us ob comp less 14 wk · 1 of 116 frames shown (2 of 2)]
[frame 59/116]
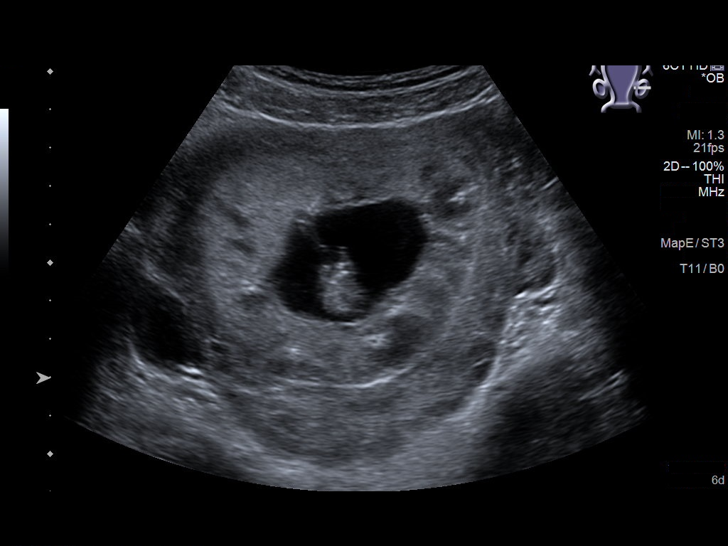

[14 of 28 positions shown; findings below may reference images not displayed]

FINDINGS: Intrauterine gestational sac: Single

Yolk sac:  Visualized.

Embryo:  Visualized.

Cardiac Activity: Visualized.

Heart Rate: 168 bpm

CRL:   31 mm   10 w 0 d                  US EDC: 05/15/2022

Subchorionic hemorrhage:  None visualized.

Maternal uterus/adnexae: Both ovaries are normal in appearance. No
mass or abnormal free fluid identified.
IMPRESSION: Single living IUP with estimated gestational age of 10 weeks 0 days,
and US EDC of 05/15/2022.

No maternal uterine or adnexal abnormality identified.

## 2023-02-02 ENCOUNTER — Emergency Department (HOSPITAL_BASED_OUTPATIENT_CLINIC_OR_DEPARTMENT_OTHER)
Admission: EM | Admit: 2023-02-02 | Discharge: 2023-02-02 | Disposition: A | Discharge status: Home / Self Care | Payer: Medicaid Other | Attending: Emergency Medicine | Admitting: Emergency Medicine

## 2023-02-02 ENCOUNTER — Emergency Department (HOSPITAL_BASED_OUTPATIENT_CLINIC_OR_DEPARTMENT_OTHER): Payer: Medicaid Other

## 2023-02-02 ENCOUNTER — Other Ambulatory Visit: Payer: Self-pay

## 2023-02-02 DIAGNOSIS — N200 Calculus of kidney: Secondary | ICD-10-CM | POA: Diagnosis not present

## 2023-02-02 DIAGNOSIS — K529 Noninfective gastroenteritis and colitis, unspecified: Secondary | ICD-10-CM | POA: Diagnosis not present

## 2023-02-02 DIAGNOSIS — R109 Unspecified abdominal pain: Secondary | ICD-10-CM | POA: Diagnosis present

## 2023-02-02 LAB — URINALYSIS, ROUTINE W REFLEX MICROSCOPIC
Glucose, UA: NEGATIVE mg/dL
Ketones, ur: NEGATIVE mg/dL
Leukocytes,Ua: NEGATIVE
Nitrite: NEGATIVE
Protein, ur: 100 mg/dL — AB
Specific Gravity, Urine: >=1.03 (ref 1.005–1.030)
pH: 6 (ref 5.0–8.0)

## 2023-02-02 LAB — COMPREHENSIVE METABOLIC PANEL
ALT: 13 U/L (ref 0–44)
AST: 21 U/L (ref 15–41)
Albumin: 3.8 g/dL (ref 3.5–5.0)
Alkaline Phosphatase: 68 U/L (ref 38–126)
Anion gap: 12 (ref 5–15)
BUN: 15 mg/dL (ref 6–20)
CO2: 23 mmol/L (ref 22–32)
Calcium: 8.2 mg/dL — ABNORMAL LOW (ref 8.9–10.3)
Chloride: 103 mmol/L (ref 98–111)
Creatinine, Ser: 0.76 mg/dL (ref 0.44–1.00)
GFR, Estimated: >60 mL/min (ref >60)
Glucose, Bld: 96 mg/dL (ref 70–99)
Potassium: 2.9 mmol/L — ABNORMAL LOW (ref 3.5–5.1)
Sodium: 138 mmol/L (ref 135–145)
Total Bilirubin: 0.2 mg/dL — ABNORMAL LOW (ref 0.3–1.2)
Total Protein: 6.9 g/dL (ref 6.5–8.1)

## 2023-02-02 LAB — CBC
HCT: 38.5 % (ref 36.0–46.0)
Hemoglobin: 13.3 g/dL (ref 12.0–15.0)
MCH: 33 pg (ref 26.0–34.0)
MCHC: 34.5 g/dL (ref 30.0–36.0)
MCV: 95.5 fL (ref 80.0–100.0)
Platelets: 104 10*3/uL — ABNORMAL LOW (ref 150–400)
RBC: 4.03 MIL/uL (ref 3.87–5.11)
RDW: 13.4 % (ref 11.5–15.5)
WBC: 2.7 10*3/uL — ABNORMAL LOW (ref 4.0–10.5)
nRBC: 0 % (ref 0.0–0.2)

## 2023-02-02 LAB — URINALYSIS, MICROSCOPIC (REFLEX): RBC / HPF: >50 RBC/hpf (ref 0–5)

## 2023-02-02 LAB — PREGNANCY, URINE: Preg Test, Ur: NEGATIVE

## 2023-02-02 LAB — LIPASE, BLOOD: Lipase: 35 U/L (ref 11–51)

## 2023-02-02 MED ORDER — ONDANSETRON HCL 4 MG/2ML IJ SOLN
4.0000 mg | Freq: Once | INTRAMUSCULAR | Status: AC
  Administered 2023-02-02: 4 mg via INTRAVENOUS
  Filled 2023-02-02: qty 2

## 2023-02-02 MED ORDER — ACETAMINOPHEN 500 MG PO TABS: 1000.0000 mg | ORAL_TABLET | Freq: Three times a day (TID) | ORAL | 0 refills | Status: AC | PRN

## 2023-02-02 MED ORDER — POTASSIUM CHLORIDE CRYS ER 20 MEQ PO TBCR
40.0000 meq | EXTENDED_RELEASE_TABLET | Freq: Once | ORAL | Status: AC
  Administered 2023-02-02: 40 meq via ORAL
  Filled 2023-02-02: qty 2

## 2023-02-02 MED ORDER — MORPHINE SULFATE (PF) 4 MG/ML IV SOLN
4.0000 mg | Freq: Once | INTRAVENOUS | Status: AC
  Administered 2023-02-02: 4 mg via INTRAVENOUS
  Filled 2023-02-02: qty 1

## 2023-02-02 MED ORDER — SODIUM CHLORIDE 0.9 % IV BOLUS
500.0000 mL | Freq: Once | INTRAVENOUS | Status: AC
  Administered 2023-02-02: 500 mL via INTRAVENOUS

## 2023-02-02 NOTE — ED Triage Notes (Signed)
Patient presents to ED via POV from home. Here with blood in her urine. Reports being seen at an urgent care and being instructed to come here. Denies dysuria. Reports nausea, vomiting and diarrhea as well.

## 2023-02-02 NOTE — ED Notes (Signed)
Pt A&OX4 ambulatory at d/c with independent steady gait. Pt verbalized understanding of d/c instructions, prescription and follow up care. 

## 2023-02-02 NOTE — Discharge Instructions (Signed)
You have been seen and discharged from the emergency department.  Your workup today found a mild low potassium as well as mild enteritis, most likely a viral stomach bug.  There is also note of kidney stones, possibly recently passed kidney stone.  Take Tylenol as needed for pain control.  Stay well-hydrated, follow-up bland diet.  Follow-up with your primary provider for further evaluation and further care. Take home medications as prescribed. If you have any worsening symptoms or further concerns for your health please return to an emergency department for further evaluation.

## 2023-02-02 NOTE — ED Provider Notes (Signed)
EMERGENCY DEPARTMENT AT [facility] Provider Note   CSN: [id number] Arrival date & time: 02/02/23  1025     History  Chief Complaint  Patient presents with   Hematuria    Laurie Oliver is a 37 y.o. female.  HPI   37 year old female presents emergency department concern for hematuria, left-sided abdominal pain/flank pain.  Patient was seen at the previous facility, noted to have blood in the urine and sent here for further evaluation.  Patient has no history of kidney stones but she does endorse pain from her left lower back wrapping around to her suprapubic area.  Denies any dysuria but endorses chills, nausea/vomiting and 1 episode of nonbloody diarrhea.  She also recently started her menstrual period.  Home Medications Prior to Admission medications   Medication Sig Start Date End Date Taking? Authorizing Provider  acetaminophen (TYLENOL) 500 MG tablet Take 2 tablets (1,000 mg total) by mouth every 6 (six) hours as needed (for pain scale < 4). 05/04/22   McVey, Prudencio Pair, CNM  benzocaine-Menthol (DERMOPLAST) 20-0.5 % AERO Apply 1 Application topically as needed for irritation (perineal discomfort). 05/04/22   McVey, Prudencio Pair, CNM  buprenorphine (SUBUTEX) 8 MG SUBL SL tablet Place 4 mg under the tongue 2 (two) times daily.     [provider]  diphenhydrAMINE (BENADRYL) 25 mg capsule Take 1 capsule (25 mg total) by mouth every 6 (six) hours as needed for itching. 05/04/22   McVey, Prudencio Pair, CNM  gabapentin (NEURONTIN) 300 MG capsule Take 1 capsule (300 mg total) by mouth 3 (three) times daily. 05/04/22 06/03/22  McVey, Prudencio Pair, CNM  NARCAN 4 MG/0.1ML LIQD nasal spray kit 1 spray as directed. Patient not taking: Reported on 04/30/2022 07/30/21   [provider]  ondansetron (ZOFRAN) 4 MG tablet Take 1 tablet (4 mg total) by mouth every 8 (eight) hours as needed for nausea (if no iv access). 05/04/22   McVey, Prudencio Pair, CNM  Prenat-Fe  Poly-Methfol-FA-DHA (VITAFOL FE+) 90-0.6-0.4-200 MG CAPS Take 1 tablet by mouth daily. 10/10/21   Tresea Mall, CNM  senna-docusate (SENOKOT-S) 8.6-50 MG tablet Take 2 tablets by mouth daily. 05/04/22   McVey, Prudencio Pair, CNM  witch hazel-glycerin (TUCKS) pad Apply 1 Application topically as needed for hemorrhoids (for pain). 05/04/22   McVey, Prudencio Pair, CNM      Allergies    Motrin [ibuprofen], Aspirin, Benzalkonium chloride, and Neosporin [neomycin-bacitracin zn-polymyx]    Review of Systems   Review of Systems  Constitutional:  Positive for chills and fatigue. Negative for fever.  Respiratory:  Negative for shortness of breath.   Cardiovascular:  Negative for chest pain.  Gastrointestinal:  Positive for abdominal pain, nausea and vomiting. Negative for diarrhea.  Genitourinary:  Positive for flank pain, frequency and hematuria. Negative for dysuria.  Skin:  Negative for rash.  Neurological:  Negative for headaches.    Physical Exam Updated Vital Signs BP (!) 131/95   Pulse 92   Temp 98.9 F (37.2 C) (Oral)   Resp 18   Ht 5' (1.524 m)   Wt 52.2 kg   SpO2 97%   BMI 22.46 kg/m  Physical Exam Vitals and nursing note reviewed.  Constitutional:      General: She is not in acute distress.    Appearance: Normal appearance.  HENT:     Head: Normocephalic.     Mouth/Throat:     Mouth: Mucous membranes are moist.  Cardiovascular:     Rate and  Rhythm: Normal rate.  Pulmonary:     Effort: Pulmonary effort is normal. No respiratory distress.  Abdominal:     Palpations: Abdomen is soft.     Tenderness: There is abdominal tenderness.     Comments: Left sided abdominal TTP wrapping around to flank  Skin:    General: Skin is warm.  Neurological:     Mental Status: She is alert and oriented to person, place, and time. Mental status is at baseline.  Psychiatric:        Mood and Affect: Mood normal.     ED Results / Procedures / Treatments   Labs (all labs ordered are listed,  but only abnormal results are displayed) Labs Reviewed  COMPREHENSIVE METABOLIC PANEL - Abnormal; Notable for the following components:      Result Value   Potassium 2.9 (*)    Calcium 8.2 (*)    Total Bilirubin 0.2 (*)    All other components within normal limits  CBC - Abnormal; Notable for the following components:   WBC 2.7 (*)    Platelets 104 (*)    All other components within normal limits  URINALYSIS, ROUTINE W REFLEX MICROSCOPIC - Abnormal; Notable for the following components:   Color, Urine AMBER (*)    APPearance CLOUDY (*)    Hgb urine dipstick LARGE (*)    Bilirubin Urine SMALL (*)    Protein, ur 100 (*)    All other components within normal limits  URINALYSIS, MICROSCOPIC (REFLEX) - Abnormal; Notable for the following components:   Bacteria, UA FEW (*)    All other components within normal limits  LIPASE, BLOOD  PREGNANCY, URINE    EKG None  Radiology No results found.  Procedures Procedures    Medications Ordered in ED Medications  sodium chloride 0.9 % bolus 500 mL (has no administration in time range)  ondansetron (ZOFRAN) injection 4 mg (has no administration in time range)  morphine (PF) 4 MG/ML injection 4 mg (has no administration in time range)    ED Course/ Medical Decision Making/ A&P                             Medical Decision Making Amount and/or Complexity of Data Reviewed Labs: ordered. Radiology: ordered.  Risk OTC drugs. Prescription drug management.   37 year old female presents emergency department abdominal pain, episode of nausea/vomiting/diarrhea.  Vital signs are stable on arrival.  Abdomen is generally uncomfortable but benign.  Blood work shows mild hypokalemia.  Urinalysis has large amount of blood, no UTI.  CT of the abdomen pelvis shows bilateral kidney stones, possibly recently passed kidney stone.  Also findings of enteritis.  Patient feels improved after IV medicine.  Will plan for outpatient symptomatic  treatment.  Of note patient is on Suboxone in regards to opiate medicine.  Will prescribe extra strength Tylenol.  Discussed this with the patient.  Patient at this time appears safe and stable for discharge and close outpatient follow up. Discharge plan and strict return to ED precautions discussed, patient verbalizes understanding and agreement.        Final Clinical Impression(s) / ED Diagnoses Final diagnoses:  None    Rx / DC Orders ED Discharge Orders     None         Rozelle Logan, DO 02/02/23 1553

## 2023-02-02 NOTE — ED Notes (Signed)
IV removed from the patient's left AC, was not documented. IV catheter intact. Bleeding controlled.

## 2023-10-29 ENCOUNTER — Emergency Department (HOSPITAL_COMMUNITY)
Admission: EM | Admit: 2023-10-29 | Discharge: 2023-10-29 | Disposition: A | Discharge status: Home / Self Care | Payer: Medicaid Other | Attending: Emergency Medicine | Admitting: Emergency Medicine

## 2023-10-29 ENCOUNTER — Encounter (HOSPITAL_COMMUNITY): Payer: Self-pay

## 2023-10-29 ENCOUNTER — Other Ambulatory Visit: Payer: Self-pay

## 2023-10-29 DIAGNOSIS — Z1152 Encounter for screening for COVID-19: Secondary | ICD-10-CM | POA: Insufficient documentation

## 2023-10-29 DIAGNOSIS — R509 Fever, unspecified: Secondary | ICD-10-CM | POA: Diagnosis present

## 2023-10-29 DIAGNOSIS — J101 Influenza due to other identified influenza virus with other respiratory manifestations: Secondary | ICD-10-CM | POA: Diagnosis not present

## 2023-10-29 DIAGNOSIS — K047 Periapical abscess without sinus: Secondary | ICD-10-CM | POA: Insufficient documentation

## 2023-10-29 LAB — GROUP A STREP BY PCR: Group A Strep by PCR: NOT DETECTED

## 2023-10-29 LAB — RESP PANEL BY RT-PCR (RSV, FLU A&B, COVID)  RVPGX2
Influenza A by PCR: POSITIVE — AB
Influenza B by PCR: NEGATIVE
Resp Syncytial Virus by PCR: NEGATIVE
SARS Coronavirus 2 by RT PCR: NEGATIVE

## 2023-10-29 MED ORDER — ONDANSETRON 4 MG PO TBDP: 4.0000 mg | ORAL_TABLET | Freq: Three times a day (TID) | ORAL | 0 refills | Status: AC | PRN

## 2023-10-29 MED ORDER — AMOXICILLIN-POT CLAVULANATE 875-125 MG PO TABS: 1.0000 | ORAL_TABLET | Freq: Two times a day (BID) | ORAL | 0 refills | Status: AC

## 2023-10-29 MED ORDER — PROMETHAZINE-DM 6.25-15 MG/5ML PO SYRP: 5.0000 mL | ORAL_SOLUTION | ORAL | 0 refills | Status: AC | PRN

## 2023-10-29 NOTE — ED Provider Notes (Signed)
 [place] EMERGENCY DEPARTMENT AT [facility] Provider Note   CSN: [id number] Arrival date & time: 10/29/23  1626     History  Chief Complaint  Patient presents with   Fever    Laurie Oliver is a 38 y.o. female who presents the emergency department complaining of fever, nasal congestion, cough, and vomiting.  She is here with her family members who are also sick.  She is mainly been feeling bad for about 2 days or so.  She is also complaining of a cracked tooth with abscess.  She has been trying nausea medication, and other over-the-counter medications with minimal relief.   Fever Associated symptoms: cough, nausea and vomiting        Home Medications Prior to Admission medications   Medication Sig Start Date End Date Taking? Authorizing Provider  amoxicillin -clavulanate (AUGMENTIN ) 875-125 MG tablet Take 1 tablet by mouth every 12 (twelve) hours. 10/29/23  Yes Kalyn Dimattia T, PA-C  ondansetron  (ZOFRAN -ODT) 4 MG disintegrating tablet Take 1 tablet (4 mg total) by mouth every 8 (eight) hours as needed for nausea or vomiting. 10/29/23  Yes Deanta Mincey T, PA-C  promethazine -dextromethorphan (PROMETHAZINE -DM) 6.25-15 MG/5ML syrup Take 5 mLs by mouth every 4 (four) hours as needed for cough. 10/29/23  Yes Marcedes Tech T, PA-C  acetaminophen  (TYLENOL ) 500 MG tablet Take 2 tablets (1,000 mg total) by mouth every 6 (six) hours as needed (for pain scale < 4). 05/04/22   McVey, Asberry LABOR, CNM  acetaminophen  (TYLENOL ) 500 MG tablet Take 2 tablets (1,000 mg total) by mouth every 8 (eight) hours as needed. 02/02/23   Horton, Roxie HERO, DO  benzocaine -Menthol  (DERMOPLAST) 20-0.5 % AERO Apply 1 Application topically as needed for irritation (perineal discomfort). 05/04/22   McVey, Asberry LABOR, CNM  buprenorphine  (SUBUTEX ) 8 MG SUBL SL tablet Place 4 mg under the tongue 2 (two) times daily.     [provider]  diphenhydrAMINE  (BENADRYL ) 25 mg capsule Take 1 capsule (25 mg  total) by mouth every 6 (six) hours as needed for itching. 05/04/22   McVey, Asberry LABOR, CNM  gabapentin  (NEURONTIN ) 300 MG capsule Take 1 capsule (300 mg total) by mouth 3 (three) times daily. 05/04/22 06/03/22  McVey, Asberry LABOR, CNM  NARCAN 4 MG/0.1ML LIQD nasal spray kit 1 spray as directed. Patient not taking: Reported on 04/30/2022 07/30/21   [provider]  ondansetron  (ZOFRAN ) 4 MG tablet Take 1 tablet (4 mg total) by mouth every 8 (eight) hours as needed for nausea (if no iv access). 05/04/22   McVey, Asberry LABOR, CNM  Prenat-Fe Poly-Methfol-FA-DHA (VITAFOL  FE+) 90-0.6-0.4-200 MG CAPS Take 1 tablet by mouth daily. 10/10/21   Lynda Bradley, CNM  senna-docusate (SENOKOT-S) 8.6-50 MG tablet Take 2 tablets by mouth daily. 05/04/22   McVey, Asberry LABOR, CNM  witch hazel-glycerin  (TUCKS) pad Apply 1 Application topically as needed for hemorrhoids (for pain). 05/04/22   McVey, Asberry LABOR, CNM      Allergies    Motrin  [ibuprofen ], Aspirin, Benzalkonium chloride, and Neosporin [neomycin-bacitracin zn-polymyx]    Review of Systems   Review of Systems  Constitutional:  Positive for fever.  HENT:  Positive for dental problem.   Respiratory:  Positive for cough.   Gastrointestinal:  Positive for nausea and vomiting.  All other systems reviewed and are negative.   Physical Exam Updated Vital Signs BP (!) 132/95   Pulse (!) 112   Temp 98.6 F (37 C) (Oral)   Resp 19   Ht 5' (  1.524 m)   Wt 49.4 kg   SpO2 98%   BMI 21.29 kg/m  Physical Exam Vitals and nursing note reviewed.  Constitutional:      Appearance: Normal appearance.  HENT:     Head: Normocephalic and atraumatic.     Mouth/Throat:     Lips: Pink.     Mouth: Mucous membranes are moist.     Pharynx: Oropharynx is clear. Uvula midline.     Tonsils: No tonsillar exudate or tonsillar abscesses.      Comments: Small apical abscess, broken tooth Eyes:     Conjunctiva/sclera: Conjunctivae normal.  Pulmonary:     Effort:  Pulmonary effort is normal. No respiratory distress.  Skin:    General: Skin is warm and dry.  Neurological:     Mental Status: She is alert.  Psychiatric:        Mood and Affect: Mood normal.        Behavior: Behavior normal.     ED Results / Procedures / Treatments   Labs (all labs ordered are listed, but only abnormal results are displayed) Labs Reviewed  RESP PANEL BY RT-PCR (RSV, FLU A&B, COVID)  RVPGX2 - Abnormal; Notable for the following components:      Result Value   Influenza A by PCR POSITIVE (*)    All other components within normal limits  GROUP A STREP BY PCR    EKG None  Radiology No results found.  Procedures Procedures    Medications Ordered in ED Medications - No data to display  ED Course/ Medical Decision Making/ A&P                                 Medical Decision Making Risk Prescription drug management.   This patient is a 38 y.o. female who presents to the ED for concern of flu like symptoms and dental abscess.   Differential diagnoses prior to evaluation: The emergent differential diagnosis includes, but is not limited to,  upper respiratory infection, lower respiratory infection, allergies, asthma, irritants, sinus/esophageal foreign body, medications, reflux, interstitial lung disease, postnasal drip, viral illness, sepsis, Dental fracture, dental caries, periapical abscess, gingival disease, peritonsillar abscess, ludwig's angina. This is not an exhaustive differential.   Past Medical History / Co-morbidities / Additional history: Chart reviewed. Pertinent results include: Hepatitis C  Physical Exam: Physical exam performed. The pertinent findings include: Initially tachycardic, but on my exam normal vital signs, no acute distress.  Has small apical abscess just behind her left upper incisor.  No PTA or Ludwig's angina.  Lab Tests/Imaging studies: I personally interpreted labs/imaging and the pertinent results include:  respiratory  panel positive for influenza A.   Disposition: After consideration of the diagnostic results and the patients response to treatment, I feel that emergency department workup does not suggest an emergent condition requiring admission or immediate intervention beyond what has been performed at this time. Patient with symptoms consistent with influenza.  Vitals are stable, low-grade fever.  No signs of dehydration, tolerating PO's.  Lungs are clear.   The plan is: Patient will be discharged with instructions to orally hydrate, rest, and use over-the-counter medications such as anti-inflammatories such as ibuprofen  and Tylenol  for fever.  Will give cough medication, Zofran  ODT, and Augmentin  for dental abscess.  Given designer, jewellery.  The patient is safe for discharge and has been instructed to return immediately for worsening symptoms, change in symptoms or any  other concerns.  Final Clinical Impression(s) / ED Diagnoses Final diagnoses:  Influenza A  Dental abscess    Rx / DC Orders ED Discharge Orders          Ordered    promethazine -dextromethorphan (PROMETHAZINE -DM) 6.25-15 MG/5ML syrup  Every 4 hours PRN        10/29/23 1935    ondansetron  (ZOFRAN -ODT) 4 MG disintegrating tablet  Every 8 hours PRN        10/29/23 1935    amoxicillin -clavulanate (AUGMENTIN ) 875-125 MG tablet  Every 12 hours        10/29/23 1935           Portions of this report may have been transcribed using voice recognition software. Every effort was made to ensure accuracy; however, inadvertent computerized transcription errors may be present.    Corie Friddie ONEIDA DEVONNA 10/29/23 1944    Lenor Hollering, MD 10/29/23 1949

## 2023-10-29 NOTE — Discharge Instructions (Addendum)
 You were seen in the emergency department today for fever, flu like symptoms, and dental abscess.  As we discussed your influenza A test is positive.  This is a viral illness very common at this time of year, and we normally treat with over-the-counter medications.  Symptoms can last for up to a week.  You can take ibuprofen  or Tylenol  for pain or fever, and I recommend alternating between the 2.  Make sure that you are drinking lots of fluids and getting plenty of rest. You can take decongestants as long as you take them with lots of water. You can use lozenges or chloraseptic spray as needed for sore throat.   Please use Tylenol  or ibuprofen  for pain.  You may use 600 mg ibuprofen  every 6 hours or 1000 mg of Tylenol  every 6 hours.  You may choose to alternate between the 2.  This would be most effective.  Do not exceed 4 g of Tylenol  within 24 hours.  Do not exceed 3200 mg ibuprofen  within 24 hours.  Continue to monitor how you are doing, and return to the emergency department for new or worsening symptoms such as chest pain, difficulty breathing not related to coughing, fever despite medication, or persistent vomiting or diarrhea.  I have sent cough syrup, nausea medicine, and antibiotics to your pharmacy. You can take the cough syrup, mucinex, and tylenol  for the same effect as the theraflu.

## 2023-10-29 NOTE — ED Triage Notes (Addendum)
 Patient has had fevers, front tooth abscess after tooth cracked, vomiting. Has been feeling bad for 2 days. Has a cough.
